# Patient Record
Sex: Female | Born: 1935 | Race: White | Hispanic: No | Smoking: Never smoker
Health system: Southern US, Community
[De-identification: ages and names within clinical notes are randomized; demographics above are authoritative.]

## PROBLEM LIST (undated history)

## (undated) DIAGNOSIS — I10 Essential (primary) hypertension: Secondary | ICD-10-CM

## (undated) DIAGNOSIS — F0390 Unspecified dementia without behavioral disturbance: Secondary | ICD-10-CM

---

## 2015-03-14 ENCOUNTER — Other Ambulatory Visit
Admission: RE | Admit: 2015-03-14 | Discharge: 2015-03-14 | Disposition: A | Discharge status: Home / Self Care | Payer: Medicare Other | Source: Ambulatory Visit | Attending: Nurse Practitioner | Admitting: Nurse Practitioner

## 2015-03-14 DIAGNOSIS — R300 Dysuria: Secondary | ICD-10-CM | POA: Insufficient documentation

## 2015-03-14 LAB — URINALYSIS COMPLETE WITH MICROSCOPIC (ARMC ONLY)
BILIRUBIN URINE: NEGATIVE
Glucose, UA: NEGATIVE mg/dL
HGB URINE DIPSTICK: NEGATIVE
Ketones, ur: NEGATIVE mg/dL
LEUKOCYTES UA: NEGATIVE
NITRITE: NEGATIVE
PH: 5.5 (ref 5.0–8.0)
Protein, ur: NEGATIVE mg/dL
Specific Gravity, Urine: 1.02 (ref 1.005–1.030)

## 2015-03-16 ENCOUNTER — Other Ambulatory Visit
Admission: RE | Admit: 2015-03-16 | Discharge: 2015-03-16 | Disposition: A | Discharge status: Home / Self Care | Payer: Medicare Other | Source: Ambulatory Visit | Attending: Nurse Practitioner | Admitting: Nurse Practitioner

## 2015-03-16 DIAGNOSIS — R7989 Other specified abnormal findings of blood chemistry: Secondary | ICD-10-CM | POA: Insufficient documentation

## 2015-03-16 DIAGNOSIS — D649 Anemia, unspecified: Secondary | ICD-10-CM | POA: Insufficient documentation

## 2015-03-16 LAB — COMPREHENSIVE METABOLIC PANEL
ALBUMIN: 3.8 g/dL (ref 3.5–5.0)
ALT: 12 U/L — ABNORMAL LOW (ref 14–54)
AST: 20 U/L (ref 15–41)
Alkaline Phosphatase: 66 U/L (ref 38–126)
Anion gap: 9 (ref 5–15)
BILIRUBIN TOTAL: 0.6 mg/dL (ref 0.3–1.2)
BUN: 33 mg/dL — AB (ref 6–20)
CO2: 28 mmol/L (ref 22–32)
Calcium: 8.6 mg/dL — ABNORMAL LOW (ref 8.9–10.3)
Chloride: 102 mmol/L (ref 101–111)
Creatinine, Ser: 1.31 mg/dL — ABNORMAL HIGH (ref 0.44–1.00)
GFR calc Af Amer: 42 mL/min — ABNORMAL LOW (ref >60)
GFR calc non Af Amer: 36 mL/min — ABNORMAL LOW (ref >60)
GLUCOSE: 167 mg/dL — AB (ref 65–99)
POTASSIUM: 4.1 mmol/L (ref 3.5–5.1)
Sodium: 139 mmol/L (ref 135–145)
TOTAL PROTEIN: 6.9 g/dL (ref 6.5–8.1)

## 2015-03-16 LAB — CBC
HEMATOCRIT: 38.1 % (ref 35.0–47.0)
HEMOGLOBIN: 12.5 g/dL (ref 12.0–16.0)
MCH: 31.6 pg (ref 26.0–34.0)
MCHC: 32.9 g/dL (ref 32.0–36.0)
MCV: 95.9 fL (ref 80.0–100.0)
Platelets: 197 10*3/uL (ref 150–440)
RBC: 3.97 MIL/uL (ref 3.80–5.20)
RDW: 13.1 % (ref 11.5–14.5)
WBC: 6 10*3/uL (ref 3.6–11.0)

## 2015-03-16 LAB — URINE CULTURE

## 2015-03-28 ENCOUNTER — Emergency Department: Payer: Medicare Other

## 2015-03-28 ENCOUNTER — Emergency Department
Admission: EM | Admit: 2015-03-28 | Discharge: 2015-03-28 | Disposition: A | Discharge status: Home / Self Care | Payer: Medicare Other | Attending: Emergency Medicine | Admitting: Emergency Medicine

## 2015-03-28 DIAGNOSIS — W01198A Fall on same level from slipping, tripping and stumbling with subsequent striking against other object, initial encounter: Secondary | ICD-10-CM | POA: Diagnosis not present

## 2015-03-28 DIAGNOSIS — S0083XA Contusion of other part of head, initial encounter: Secondary | ICD-10-CM

## 2015-03-28 DIAGNOSIS — R4182 Altered mental status, unspecified: Secondary | ICD-10-CM | POA: Diagnosis not present

## 2015-03-28 DIAGNOSIS — Y9389 Activity, other specified: Secondary | ICD-10-CM | POA: Insufficient documentation

## 2015-03-28 DIAGNOSIS — Y998 Other external cause status: Secondary | ICD-10-CM | POA: Insufficient documentation

## 2015-03-28 DIAGNOSIS — S0181XA Laceration without foreign body of other part of head, initial encounter: Secondary | ICD-10-CM

## 2015-03-28 DIAGNOSIS — S0990XA Unspecified injury of head, initial encounter: Secondary | ICD-10-CM | POA: Diagnosis present

## 2015-03-28 DIAGNOSIS — Y92128 Other place in nursing home as the place of occurrence of the external cause: Secondary | ICD-10-CM | POA: Insufficient documentation

## 2015-03-28 NOTE — ED Notes (Signed)
Pt from assisted living via EMS, C/O fall with R. Hip pain and laceration to forehead bleeding controlled

## 2015-03-28 NOTE — Discharge Instructions (Signed)
You were evaluated after head injury, no serious injury was found. Return to emergency room if any worsening condition including confusion altered mental status or signs of infection.   Facial Laceration  A facial laceration is a cut on the face. These injuries can be painful and cause bleeding. Lacerations usually heal quickly, but they need special care to reduce scarring. DIAGNOSIS  Your health care provider will take a medical history, ask for details about how the injury occurred, and examine the wound to determine how deep the cut is. TREATMENT  Some facial lacerations may not require closure. Others may not be able to be closed because of an increased risk of infection. The risk of infection and the chance for successful closure will depend on various factors, including the amount of time since the injury occurred. The wound may be cleaned to help prevent infection. If closure is appropriate, pain medicines may be given if needed. Your health care provider will use stitches (sutures), wound glue (adhesive), or skin adhesive strips to repair the laceration. These tools bring the skin edges together to allow for faster healing and a better cosmetic outcome. If needed, you may also be given a tetanus shot. HOME CARE INSTRUCTIONS  Only take over-the-counter or prescription medicines as directed by your health care provider.  Follow your health care provider's instructions for wound care. These instructions will vary depending on the technique used for closing the wound. For Sutures:  Keep the wound clean and dry.   If you were given a bandage (dressing), you should change it at least once a day. Also change the dressing if it becomes wet or dirty, or as directed by your health care provider.   Wash the wound with soap and water 2 times a day. Rinse the wound off with water to remove all soap. Pat the wound dry with a clean towel.   After cleaning, apply a thin layer of the antibiotic  ointment recommended by your health care provider. This will help prevent infection and keep the dressing from sticking.   You may shower as usual after the first 24 hours. Do not soak the wound in water until the sutures are removed.   Get your sutures removed as directed by your health care provider. With facial lacerations, sutures should usually be taken out after 4-5 days to avoid stitch marks.   Wait a few days after your sutures are removed before applying any makeup. For Skin Adhesive Strips:  Keep the wound clean and dry.   Do not get the skin adhesive strips wet. You may bathe carefully, using caution to keep the wound dry.   If the wound gets wet, pat it dry with a clean towel.   Skin adhesive strips will fall off on their own. You may trim the strips as the wound heals. Do not remove skin adhesive strips that are still stuck to the wound. They will fall off in time.  For Wound Adhesive:  You may briefly wet your wound in the shower or bath. Do not soak or scrub the wound. Do not swim. Avoid periods of heavy sweating until the skin adhesive has fallen off on its own. After showering or bathing, gently pat the wound dry with a clean towel.   Do not apply liquid medicine, cream medicine, ointment medicine, or makeup to your wound while the skin adhesive is in place. This may loosen the film before your wound is healed.   If a dressing is placed over  the wound, be careful not to apply tape directly over the skin adhesive. This may cause the adhesive to be pulled off before the wound is healed.   Avoid prolonged exposure to sunlight or tanning lamps while the skin adhesive is in place.  The skin adhesive will usually remain in place for 5-10 days, then naturally fall off the skin. Do not pick at the adhesive film.  After Healing: Once the wound has healed, cover the wound with sunscreen during the day for 1 full year. This can help minimize scarring. Exposure to  ultraviolet light in the first year will darken the scar. It can take 1-2 years for the scar to lose its redness and to heal completely.  SEEK MEDICAL CARE IF:  You have a fever. SEEK IMMEDIATE MEDICAL CARE IF:  You have redness, pain, or swelling around the wound.   You see ayellowish-white fluid (pus) coming from the wound.    This information is not intended to replace advice given to you by your health care provider. Make sure you discuss any questions you have with your health care provider.   Document Released: 03/27/2004 Document Revised: 03/10/2014 Document Reviewed: 09/30/2012 Elsevier Interactive Patient Education 2016 Elsevier Inc.  Facial or Scalp Contusion  A facial or scalp contusion is a deep bruise on the face or head. Contusions happen when an injury causes bleeding under the skin. Signs of bruising include pain, puffiness (swelling), and discolored skin. The contusion may turn blue, purple, or yellow. HOME CARE  Only take medicines as told by your doctor.  Put ice on the injured area.  Put ice in a plastic bag.  Place a towel between your skin and the bag.  Leave the ice on for 20 minutes, 2-3 times a day. GET HELP IF:  You have bite problems.  You have pain when chewing.  You are worried about your face not healing normally. GET HELP RIGHT AWAY IF:   You have severe pain or a headache and medicine does not help.  You are very tired or confused, or your personality changes.  You throw up (vomit).  You have a nosebleed that will not stop.  You see two of everything (double vision) or have blurry vision.  You have fluid coming from your nose or ear.  You have problems walking or using your arms or legs. MAKE SURE YOU:   Understand these instructions.  Will watch your condition.  Will get help right away if you are not doing well or get worse.   This information is not intended to replace advice given to you by your health care provider.  Make sure you discuss any questions you have with your health care provider.   Document Released: 02/06/2011 Document Revised: 03/10/2014 Document Reviewed: 09/30/2012 Elsevier Interactive Patient Education [employer]

## 2015-03-28 NOTE — ED Provider Notes (Signed)
[facility] Emergency Department Provider Note   ____________________________________________  Time seen: Approximately 7 PM I have reviewed the triage vital signs and the triage nursing note.  HISTORY  Chief Complaint Fall   Historian Patient, poor historian The son  HPI Janice Reese is a 82 y.o. female is from an assisted living facility. She reportedly lost her balance and struck her forehead. Denies headache or confusion or loss of consciousness. Supposedly had reported initially hip pain, but now not complaining of any pelvic or hip pain or extremity pain. No neck pain. Denies any recent illnesses such as cough, congestion, fever, vomiting, diarrhea, or concern for dehydration. Altered mental status.    History reviewed. No pertinent past medical history.  There are no active problems to display for this patient.   History reviewed. No pertinent past surgical history.  No current outpatient prescriptions on file.  Allergies Review of patient's allergies indicates no known allergies.  No family history on file.  Social History Social History  Substance Use Topics  . Smoking status: Never Smoker   . Smokeless tobacco: None  . Alcohol Use: No    Review of Systems  Constitutional: Negative for fever. Eyes: Negative for visual changes. ENT: Negative for sore throat. Cardiovascular: Negative for chest pain. Respiratory: Negative for shortness of breath. Gastrointestinal: Negative for abdominal pain, vomiting and diarrhea. Genitourinary: Negative for dysuria. Musculoskeletal: Negative for back pain. Skin: Negative for rash. Neurological: Negative for headache. 10 point Review of Systems otherwise negative ____________________________________________   PHYSICAL EXAM:  VITAL SIGNS: ED Triage Vitals  Enc Vitals Group     BP 03/28/15 1700 115/48 mmHg     Pulse Rate 03/28/15 1702 59     Resp 03/28/15 1900 16     Temp 03/28/15 1701  97.6 F (36.4 C)     Temp src --      SpO2 03/28/15 1702 100 %     Weight 03/28/15 1701 117 lb 8 oz (53.298 kg)     Height 03/28/15 1701  (1.6 m)     Head Cir --      Peak Flow --      Pain Score 03/28/15 1702 2     Pain Loc --      Pain Edu? --      Excl. in GC? --      Constitutional: Alert and cooperative, but poor historian.. Well appearing and in no distress. Eyes: Conjunctivae are normal. PERRL. Normal extraocular movements. ENT   Head: Normocephalic. Hematoma left forehead with small laceration with abrasion.   Nose: No congestion/rhinnorhea.   Mouth/Throat: Mucous membranes are moist.   Neck: No stridor. No C-spine tenderness to palpation or range of motion. Cardiovascular/Chest: Normal rate, regular rhythm.  No murmurs, rubs, or gallops. Respiratory: Normal respiratory effort without tachypnea nor retractions. Breath sounds are clear and equal bilaterally. No wheezes/rales/rhonchi. Gastrointestinal: Soft. No distention, no guarding, no rebound. Nontender.    Genitourinary/rectal:Deferred Musculoskeletal: Pelvis stable. His nontender. Nontender with normal range of motion in all extremities. No joint effusions.  No lower extremity tenderness.  No edema. Neurologic:  Normal speech and language. No gross or focal neurologic deficits are appreciated. Skin:  Skin is warm, dry. No rash.Marland Kitchen Psychiatric: Mood and affect are normal. Speech and behavior are normal. Patient exhibits appropriate insight and judgment.  ____________________________________________   EKG I, Governor Rooks, MD, the attending physician have personally viewed and interpreted all ECGs.  None ____________________________________________  LABS (pertinent positives/negatives)  None  ____________________________________________  RADIOLOGY All Xrays were viewed by me. Imaging interpreted by Radiologist.  CT head noncontrast: No acute intracranial  pathology __________________________________________  PROCEDURES  Procedure(s) performed: LACERATION REPAIR Performed by: Governor Rooks Authorized by: Governor Rooks Consent: Verbal consent obtained. Risks and benefits: risks, benefits and alternatives were discussed Consent given by: patient Wound explored  Laceration Location: Forehead  Laceration Length: 3cm  No Foreign Bodies seen or palpated   Irrigation method: syringe Amount of cleaning: standard  Skin closure: Skin glue     Patient tolerance: Patient tolerated the procedure well with no immediate complications.   Critical Care performed: None  ____________________________________________   ED COURSE / ASSESSMENT AND PLAN  Pertinent labs & imaging results that were available during my care of the patient were reviewed by me and considered in my medical decision making (see chart for details).   In terms of trauma, she does have a forehead hematoma and laceration. The skin is extremely thin and I think that stitches would probably tear through the skin. There is a tiny gap which may be a bit of an abrasion associated at the edge of the laceration.  I used skin glue and did warn for possibility of scarring there.  Family and patient feel like her off balance is pretty baseline for her and she is not having any additional symptoms concerning for any new metabolic concerns and we decided to forego any additional urine or blood testing.    CONSULTATIONS:   None   Patient / Family / Caregiver informed of clinical course, medical decision-making process, and agree with plan.   I discussed return precautions, follow-up instructions, and discharged instructions with patient and/or family.   ___________________________________________   FINAL CLINICAL IMPRESSION(S) / ED DIAGNOSES   Final diagnoses:  Facial laceration, initial encounter  Hematoma of face, initial encounter              Note:  This dictation was prepared with Dragon dictation. Any transcriptional errors that result from this process are unintentional   Governor Rooks, MD 03/28/15 2127

## 2015-04-09 ENCOUNTER — Emergency Department: Payer: Medicare Other

## 2015-04-09 ENCOUNTER — Inpatient Hospital Stay (HOSPITAL_COMMUNITY)
Admit: 2015-04-09 | Discharge: 2015-04-09 | Disposition: A | Discharge status: Home / Self Care | Payer: Medicare Other | Attending: Internal Medicine | Admitting: Internal Medicine

## 2015-04-09 ENCOUNTER — Encounter: Payer: Self-pay | Admitting: Emergency Medicine

## 2015-04-09 DIAGNOSIS — R0600 Dyspnea, unspecified: Secondary | ICD-10-CM | POA: Diagnosis not present

## 2015-04-09 DIAGNOSIS — J9601 Acute respiratory failure with hypoxia: Secondary | ICD-10-CM | POA: Diagnosis not present

## 2015-04-09 DIAGNOSIS — I25111 Atherosclerotic heart disease of native coronary artery with angina pectoris with documented spasm: Secondary | ICD-10-CM | POA: Insufficient documentation

## 2015-04-09 DIAGNOSIS — G9341 Metabolic encephalopathy: Secondary | ICD-10-CM | POA: Diagnosis present

## 2015-04-09 DIAGNOSIS — I959 Hypotension, unspecified: Secondary | ICD-10-CM | POA: Diagnosis present

## 2015-04-09 DIAGNOSIS — Z515 Encounter for palliative care: Secondary | ICD-10-CM | POA: Diagnosis not present

## 2015-04-09 DIAGNOSIS — Z66 Do not resuscitate: Secondary | ICD-10-CM | POA: Diagnosis present

## 2015-04-09 DIAGNOSIS — I214 Non-ST elevation (NSTEMI) myocardial infarction: Secondary | ICD-10-CM | POA: Diagnosis present

## 2015-04-09 DIAGNOSIS — I447 Left bundle-branch block, unspecified: Secondary | ICD-10-CM | POA: Diagnosis present

## 2015-04-09 DIAGNOSIS — N179 Acute kidney failure, unspecified: Secondary | ICD-10-CM

## 2015-04-09 DIAGNOSIS — F0390 Unspecified dementia without behavioral disturbance: Secondary | ICD-10-CM | POA: Diagnosis present

## 2015-04-09 DIAGNOSIS — I1310 Hypertensive heart and chronic kidney disease without heart failure, with stage 1 through stage 4 chronic kidney disease, or unspecified chronic kidney disease: Secondary | ICD-10-CM | POA: Insufficient documentation

## 2015-04-09 DIAGNOSIS — N189 Chronic kidney disease, unspecified: Secondary | ICD-10-CM | POA: Diagnosis present

## 2015-04-09 DIAGNOSIS — R079 Chest pain, unspecified: Secondary | ICD-10-CM

## 2015-04-09 DIAGNOSIS — R54 Age-related physical debility: Secondary | ICD-10-CM | POA: Insufficient documentation

## 2015-04-09 DIAGNOSIS — D72829 Elevated white blood cell count, unspecified: Secondary | ICD-10-CM | POA: Diagnosis present

## 2015-04-09 DIAGNOSIS — R0603 Acute respiratory distress: Secondary | ICD-10-CM | POA: Insufficient documentation

## 2015-04-09 DIAGNOSIS — I350 Nonrheumatic aortic (valve) stenosis: Secondary | ICD-10-CM | POA: Insufficient documentation

## 2015-04-09 DIAGNOSIS — I130 Hypertensive heart and chronic kidney disease with heart failure and stage 1 through stage 4 chronic kidney disease, or unspecified chronic kidney disease: Secondary | ICD-10-CM | POA: Diagnosis present

## 2015-04-09 DIAGNOSIS — I429 Cardiomyopathy, unspecified: Secondary | ICD-10-CM | POA: Diagnosis present

## 2015-04-09 DIAGNOSIS — I5021 Acute systolic (congestive) heart failure: Secondary | ICD-10-CM | POA: Diagnosis present

## 2015-04-09 DIAGNOSIS — I255 Ischemic cardiomyopathy: Secondary | ICD-10-CM | POA: Insufficient documentation

## 2015-04-09 HISTORY — DX: Unspecified dementia, unspecified severity, without behavioral disturbance, psychotic disturbance, mood disturbance, and anxiety: F03.90

## 2015-04-09 HISTORY — DX: Essential (primary) hypertension: I10

## 2015-04-09 LAB — COMPREHENSIVE METABOLIC PANEL
ALK PHOS: 67 U/L (ref 38–126)
ALT: 50 U/L (ref 14–54)
ANION GAP: 11 (ref 5–15)
AST: 86 U/L — ABNORMAL HIGH (ref 15–41)
Albumin: 3.4 g/dL — ABNORMAL LOW (ref 3.5–5.0)
BILIRUBIN TOTAL: 0.6 mg/dL (ref 0.3–1.2)
BUN: 53 mg/dL — ABNORMAL HIGH (ref 6–20)
CALCIUM: 8.5 mg/dL — AB (ref 8.9–10.3)
CO2: 23 mmol/L (ref 22–32)
CREATININE: 1.64 mg/dL — AB (ref 0.44–1.00)
Chloride: 104 mmol/L (ref 101–111)
GFR, EST AFRICAN AMERICAN: 32 mL/min — AB (ref >60)
GFR, EST NON AFRICAN AMERICAN: 28 mL/min — AB (ref >60)
GLUCOSE: 153 mg/dL — AB (ref 65–99)
Potassium: 4.5 mmol/L (ref 3.5–5.1)
Sodium: 138 mmol/L (ref 135–145)
TOTAL PROTEIN: 6.5 g/dL (ref 6.5–8.1)

## 2015-04-09 LAB — CBC
HEMATOCRIT: 34.1 % — AB (ref 35.0–47.0)
HEMOGLOBIN: 11.4 g/dL — AB (ref 12.0–16.0)
MCH: 30.9 pg (ref 26.0–34.0)
MCHC: 33.3 g/dL (ref 32.0–36.0)
MCV: 92.9 fL (ref 80.0–100.0)
Platelets: 184 10*3/uL (ref 150–440)
RBC: 3.68 MIL/uL — ABNORMAL LOW (ref 3.80–5.20)
RDW: 12.9 % (ref 11.5–14.5)
WBC: 10.9 10*3/uL (ref 3.6–11.0)

## 2015-04-09 LAB — URINALYSIS COMPLETE WITH MICROSCOPIC (ARMC ONLY)
Bilirubin Urine: NEGATIVE
Glucose, UA: NEGATIVE mg/dL
Hgb urine dipstick: NEGATIVE
Ketones, ur: NEGATIVE mg/dL
Nitrite: NEGATIVE
PROTEIN: NEGATIVE mg/dL
SPECIFIC GRAVITY, URINE: 1.027 (ref 1.005–1.030)
SQUAMOUS EPITHELIAL / LPF: NONE SEEN
pH: 5 (ref 5.0–8.0)

## 2015-04-09 LAB — PROTIME-INR
INR: 1.06
Prothrombin Time: 14 seconds (ref 11.4–15.0)

## 2015-04-09 LAB — HEMOGLOBIN A1C: Hgb A1c MFr Bld: 5 % (ref 4.0–6.0)

## 2015-04-09 LAB — HEPARIN LEVEL (UNFRACTIONATED): HEPARIN UNFRACTIONATED: 0.31 [IU]/mL (ref 0.30–0.70)

## 2015-04-09 LAB — TROPONIN I
Troponin I: 6.53 ng/mL — ABNORMAL HIGH (ref <0.031)
Troponin I: 7.91 ng/mL — ABNORMAL HIGH (ref <0.031)

## 2015-04-09 LAB — APTT: APTT: 28 s (ref 24–36)

## 2015-04-09 LAB — MRSA PCR SCREENING: MRSA BY PCR: NEGATIVE

## 2015-04-09 LAB — GLUCOSE, CAPILLARY: Glucose-Capillary: 198 mg/dL — ABNORMAL HIGH (ref 65–99)

## 2015-04-09 MED ORDER — DONEPEZIL HCL 5 MG PO TABS: 10.0000 mg | ORAL_TABLET | Freq: Every day | ORAL | Status: DC

## 2015-04-09 MED ORDER — HEPARIN BOLUS VIA INFUSION
3200.0000 [IU] | Freq: Once | INTRAVENOUS | Status: AC
  Administered 2015-04-09: 3200 [IU] via INTRAVENOUS
  Filled 2015-04-09: qty 3200

## 2015-04-09 MED ORDER — SODIUM CHLORIDE 0.9 % IV BOLUS (SEPSIS)
1000.0000 mL | Freq: Once | INTRAVENOUS | Status: AC
  Administered 2015-04-09: 500 mL via INTRAVENOUS

## 2015-04-09 MED ORDER — NITROGLYCERIN 0.4 MG SL SUBL: 0.4000 mg | SUBLINGUAL_TABLET | SUBLINGUAL | Status: DC | PRN

## 2015-04-09 MED ORDER — ACETAMINOPHEN 325 MG PO TABS: 650.0000 mg | ORAL_TABLET | ORAL | Status: DC | PRN

## 2015-04-09 MED ORDER — SODIUM CHLORIDE 0.9 % IV SOLN: INTRAVENOUS | Status: DC

## 2015-04-09 MED ORDER — ATORVASTATIN CALCIUM 20 MG PO TABS: 80.0000 mg | ORAL_TABLET | Freq: Every day | ORAL | Status: DC

## 2015-04-09 MED ORDER — FUROSEMIDE 10 MG/ML IJ SOLN
40.0000 mg | Freq: Once | INTRAMUSCULAR | Status: AC
  Administered 2015-04-09: 40 mg via INTRAVENOUS

## 2015-04-09 MED ORDER — ASPIRIN 81 MG PO CHEW
324.0000 mg | CHEWABLE_TABLET | ORAL | Status: AC
  Administered 2015-04-09: 324 mg via ORAL
  Filled 2015-04-09: qty 4

## 2015-04-09 MED ORDER — FUROSEMIDE 10 MG/ML IJ SOLN
INTRAMUSCULAR | Status: AC
  Administered 2015-04-09: 40 mg via INTRAVENOUS
  Filled 2015-04-09: qty 4

## 2015-04-09 MED ORDER — ASPIRIN EC 81 MG PO TBEC: 81.0000 mg | DELAYED_RELEASE_TABLET | Freq: Every day | ORAL | Status: DC

## 2015-04-09 MED ORDER — CETYLPYRIDINIUM CHLORIDE 0.05 % MT LIQD
7.0000 mL | Freq: Two times a day (BID) | OROMUCOSAL | Status: DC
Start: 2015-04-09 — End: 2015-04-10
  Administered 2015-04-09: 7 mL via OROMUCOSAL

## 2015-04-09 MED ORDER — MORPHINE SULFATE (PF) 2 MG/ML IV SOLN
INTRAVENOUS | Status: AC
  Administered 2015-04-09: 2 mg via INTRAVENOUS
  Filled 2015-04-09: qty 1

## 2015-04-09 MED ORDER — ASPIRIN 300 MG RE SUPP: 300.0000 mg | RECTAL | Status: AC

## 2015-04-09 MED ORDER — ONDANSETRON HCL 4 MG/2ML IJ SOLN: 4.0000 mg | Freq: Four times a day (QID) | INTRAMUSCULAR | Status: DC | PRN

## 2015-04-09 MED ORDER — PRAVASTATIN SODIUM 20 MG PO TABS: 20.0000 mg | ORAL_TABLET | Freq: Every day | ORAL | Status: DC

## 2015-04-09 MED ORDER — MORPHINE SULFATE (PF) 2 MG/ML IV SOLN
2.0000 mg | Freq: Once | INTRAVENOUS | Status: AC
  Administered 2015-04-09: 2 mg via INTRAVENOUS

## 2015-04-09 MED ORDER — HEPARIN (PORCINE) IN NACL 100-0.45 UNIT/ML-% IJ SOLN
700.0000 [IU]/h | INTRAMUSCULAR | Status: DC
  Administered 2015-04-09: 650 [IU]/h via INTRAVENOUS
  Filled 2015-04-09 (×2): qty 250

## 2015-04-09 NOTE — NC FL2 (Signed)
  [place] MEDICAID FL2 LEVEL OF CARE SCREENING TOOL     IDENTIFICATION  Patient Name: Janice Reese Birthdate: 07/17/1928 Sex: female Admission Date (Current Location): 04/21/2015  [place] and [place] Number:  Chiropodist and Address:  [facility], [address] [place], [place] [address]      Provider Number: [id number]  Attending Physician Name and Address:  Wyatt Haste, MD  Relative Name and Phone Number:       Current Level of Care: Hospital Recommended Level of Care: Assisted Living Facility Prior Approval Number:    Date Approved/Denied:   PASRR Number:    Discharge Plan: SNF    Current Diagnoses: Patient Active Problem List   Diagnosis Date Noted  . NSTEMI (non-ST elevated myocardial infarction) (HCC) 04/24/2015  . AKI (acute kidney injury) (HCC) 04/29/2015    Orientation RESPIRATION BLADDER Height & Weight     Self  O2 (Nasal Cannula 5 (L/min) ) Continent Weight: 112 lb 12.8 oz (51.166 kg) Height:   (162.6 cm)  BEHAVIORAL SYMPTOMS/MOOD NEUROLOGICAL BOWEL NUTRITION STATUS   (None )  (None ) Continent Diet (Heart )  AMBULATORY STATUS COMMUNICATION OF NEEDS Skin   Limited Assist Verbally Normal                       Personal Care Assistance Level of Assistance  Bathing, Feeding, Dressing Bathing Assistance: Limited assistance Feeding assistance: Independent Dressing Assistance: Limited assistance     Functional Limitations Info  Sight, Hearing, Speech Sight Info: Impaired Hearing Info: Adequate Speech Info: Adequate    SPECIAL CARE FACTORS FREQUENCY                       Contractures      Additional Factors Info  Code Status, Allergies Code Status Info:  (DNR ) Allergies Info:  (None )           Current Medications (04/05/2015):  This is the current hospital active medication list Current Facility-Administered Medications  Medication Dose Route Frequency Provider Last Rate Last Dose   . acetaminophen (TYLENOL) tablet 650 mg  650 mg Oral Q4H PRN Wyatt Haste, MD      . Melene Muller ON 05-08-15] aspirin EC tablet 81 mg  81 mg Oral Daily Wyatt Haste, MD      . atorvastatin (LIPITOR) tablet 80 mg  80 mg Oral q1800 Wyatt Haste, MD      . heparin ADULT infusion 100 units/mL (25000 units/250 mL)  650 Units/hr Intravenous Continuous Wyatt Haste, MD 6.5 mL/hr at 04/08/2015 1452 650 Units/hr at 04/22/2015 1452  . nitroGLYCERIN (NITROSTAT) SL tablet 0.4 mg  0.4 mg Sublingual Q5 Min x 3 PRN Wyatt Haste, MD      . ondansetron [facility]) injection 4 mg  4 mg Intravenous Q6H PRN Wyatt Haste, MD         Discharge Medications: Please see discharge summary for a list of discharge medications.  Relevant Imaging Results:  Relevant Lab Results:   Additional Information  (SSN [id number])  Verta Ellen Sunkins, LCSW

## 2015-04-09 NOTE — ED Notes (Signed)
Attempted to call report to 2A.  Pt will be assigned to 251 and room being cleaned.  Told to call back in 10 minutes.

## 2015-04-09 NOTE — Clinical Social Work Note (Signed)
Clinical Social Work Assessment  Patient Details  Name: Janice Reese MRN: [id number] Date of Birth: September 18, 1928  Date of referral:  04/11/2015               Reason for consult:  Other (Comment Required) (Chest pains/dementia)                Permission sought to share information with:  Facility Medical sales representative, Family Supports Permission granted to share information::  Yes, Verbal Permission Granted  Name::     Son Janice Reese [phone] Janice Reese [phone]  Agency::  [facility]  Relationship::  son and gransdon  Contact Information:  yes  Housing/Transportation Living arrangements for the past 2 months:  Skilled Building surveyor of Information:  Adult Children Patient Interpreter Needed:  None Criminal Activity/Legal Involvement Pertinent to Current Situation/Hospitalization:  No - Comment as needed Significant Relationships:  Adult Children, Other Family Members (Grandson Janice Reese) Lives with:  Facility Resident Do you feel safe going back to the place where you live?  Yes Need for family participation in patient care:  Yes (Comment)  Care giving concerns: His grandmother has had dementia for 5 years but is pretty mobile   Office manager / plan:  Patient will be admitted to 2nd floor and once better will return to [employer]  Employment status:  Retired Health and safety inspector:  Medicaid In [place] PT Recommendations:  Skilled Nursing Facility Information / Referral to community resources:  Acute Rehab  Patient/Family's Response to care:  TBD Son was not present for interview. He is in [place] and grandson was here  Patient/Family's Understanding of and Emotional Response to Diagnosis, Current Treatment, and Prognosis:  Hopes his grandmother gets better ( recent loss of his own mother Sept 2016)none  Emotional Assessment Appearance:  Appears stated age Attitude/Demeanor/Rapport:  Lethargic Affect (typically observed):   Calm, Flat, Withdrawn Orientation:  Fluctuating Orientation (Suspected and/or reported Sundowners) Alcohol / Substance use:   none Psych involvement (Current and /or in the community):  No (Comment)  Discharge Needs  Concerns to be addressed:  No discharge needs identified Readmission within the last 30 days:  No Current discharge risk:  Cognitively Impaired, None Barriers to Discharge:  No Barriers Identified   Cheron Schaumann, LCSW 04/06/2015, 2:48 PM

## 2015-04-09 NOTE — Progress Notes (Signed)
Notified Dr. Anne Hahn of most recent troponin of 8.44 md acknowledged stated to continue trending troponin's, pt has not c/o chest pain will continue to monitor and assess pt

## 2015-04-09 NOTE — Progress Notes (Addendum)
Pt. admitted to unit, rm251 from ED, report from Jeannett Senior, [place]. Oriented to room, call bell, Ascom phones and staff. Bed in low position. Fall safety plan reviewed, pt is confused so RN is only signature on contract which was placed on wall, yellow non-skid socks in place, bed alarm on. Full assessment to Epic; skin assessed with Greer Ee, RN; abrasion to left forehead & R ankle; L ankle cut (cleansed and dressed). Telemetry box verified with tele clerk and Linford Arnold, RN: [phone] . Will continue to monitor.    Patient has severe dementia and unable to answer most admission questions. Grandson able to help out with most of the admission, but unable to answer a few of them; facility provided no paperwork regarding flu or pneumonia vaccination status so these questions have been left unanswered for the time being.   Heparin drip initiated & verified at bedside with Baxter Hire, RN.

## 2015-04-09 NOTE — Progress Notes (Signed)
*  PRELIMINARY RESULTS* Echocardiogram 2D Echocardiogram has been performed.  Janice Reese 27-Apr-2015, 10:22 PM

## 2015-04-09 NOTE — Plan of Care (Signed)
Problem: Consults Goal: Chest Pain Patient Education (See Patient Education module for education specifics.)  Outcome: Progressing Education provided to grandson, who is currently at bedside.  Problem: Phase I Progression Outcomes Goal: Hemodynamically stable Outcome: Progressing Patient currently requiring 5L supplemental O2. Goal: Anginal pain relieved Outcome: Completed/Met Date Met:  04/20/2015 No chest pain at this time.  Problem: Education: Goal: Knowledge of [facility] Education information/materials will improve Outcome: Progressing Education provided to grandson.

## 2015-04-09 NOTE — Progress Notes (Addendum)
CSW contacted [name] at [facility]. Per Janice Reese patient is a resident at their facility. She reports that patient uses O2 and a walker at baseline. Per [facility] patient does not have any HH services at this time. She reports that patient can return to [facility] at discharge. Janice Reese requested to be given a "heads up" of patient's discharge date so that she may "evaluate" patient. FL2 completed and faxed to [facility] at [facility]. PASRR does not exist and is not needed per [name].   CSW contacted patient's son/ HPOA Janice Reese. Left a voicemail. Awaiting call back. CSW will continue to follow and assist.   [name], MSW, LCSW-A Clinical Social Work Department [phone]

## 2015-04-09 NOTE — ED Notes (Signed)
Pt from [facility] via ems with weakness and altered mental status today; facility states hx of dementia but that pt seems different today, and was difficult to rouse.

## 2015-04-09 NOTE — Progress Notes (Signed)
RN called into room by patients grandson, upon arrival, RN found patient diaphoretic, audible crackles, SpO2 70s on 5L O2. NRB placed, respiratory called, MD notified, orders for  iv lasix received and medication given. MD at bedside currently speaking with family in room via telephone, discussing BiPAP. SpO2 82% on NRB currently.

## 2015-04-09 NOTE — H&P (Signed)
[facility] - [place] at [facility]   PATIENT NAME: Janice Reese    MR#:  [id number]  DATE OF BIRTH:  April 21, 1928   DATE OF ADMISSION:  04-21-2015  PRIMARY CARE PHYSICIAN: No primary care provider on file.   REQUESTING/REFERRING PHYSICIAN: Paduchowski  CHIEF COMPLAINT:   Chest pain  HISTORY OF PRESENT ILLNESS:  Janice Reese  is a 82 y.o. female with a known history of dementia without behavioral disturbances, essential hypertension who is presenting with chest pain. She is unable to provide meaningful information given mental status/medical condition secondary to baseline dementia. Per history patient was slightly more confused today at her nursing facility as well as complaining of chest discomfort. When asked she currently states "I don't feel well" but unable to further specify. On arrival to emergency department noted to have elevated troponin as well as abnormal EKG concerning for lateral infarct  PAST MEDICAL HISTORY:   Past Medical History  Diagnosis Date  . Hypertension   . Dementia     PAST SURGICAL HISTORY:  History reviewed. No pertinent past surgical history.  SOCIAL HISTORY:   Social History  Substance Use Topics  . Smoking status: Never Smoker   . Smokeless tobacco: Not on file  . Alcohol Use: No    FAMILY HISTORY:   Family History  Problem Relation Age of Onset  . Diabetes Mellitus II Neg Hx     DRUG ALLERGIES:  No Known Allergies  REVIEW OF SYSTEMS:  Unobtainable at this time given patient's mental status/medical condition   MEDICATIONS AT HOME:   Prior to Admission medications   Not on File      VITAL SIGNS:  Blood pressure 93/58, pulse 75, temperature 97.6 F (36.4 C), temperature source Oral, resp. rate 23, height  (1.626 m), weight 120 lb (54.432 kg), SpO2 97 %.  PHYSICAL EXAMINATION:   VITAL SIGNS: Filed Vitals:   Apr 21, 2015 1115 04-21-15 1130  BP: 104/71 93/58  Pulse: 71 75  Temp:    Resp:  24 23   GENERAL:82 y.o.female moderate distress given mental status.  HEAD: Normocephalic, atraumatic.  EYES: Pupils equal, round, reactive to light. Unable to assess extraocular muscles given mental status/medical condition. No scleral icterus.  MOUTH: Moist mucosal membrane. Dentition intact. No abscess noted.  EAR, NOSE, THROAT: Clear without exudates. No external lesions.  NECK: Supple. No thyromegaly. No nodules. No JVD.  PULMONARY: Clear to ascultation, without wheeze rails or rhonci. No use of accessory muscles, Good respiratory effort. good air entry bilaterally CHEST: Nontender to palpation.  CARDIOVASCULAR: S1 and S2. Regular rate and rhythm. No murmurs, rubs, or gallops. No edema. Pedal pulses 2+ bilaterally.  GASTROINTESTINAL: Soft, nontender, nondistended. No masses. Positive bowel sounds. No hepatosplenomegaly.  MUSCULOSKELETAL: No swelling, clubbing, or edema. Range of motion full in all extremities.  NEUROLOGIC: Unable to assess given mental status/medical condition SKIN: Multiple skin tears the largest on the left lower extremity/shin with dressing intact with serous discharge No ulceration, lesions, rashes, or cyanosis. Skin warm and dry. Turgor intact.  PSYCHIATRIC: Unable to assess given mental status/medical condition. She is oriented 1      LABORATORY PANEL:   CBC  Recent Labs Lab Apr 21, 2015 1033  WBC 10.9  HGB 11.4*  HCT 34.1*  PLT 184   ------------------------------------------------------------------------------------------------------------------  Chemistries   Recent Labs Lab 21-Apr-2015 1033  NA 138  K 4.5  CL 104  CO2 23  GLUCOSE 153*  BUN 53*  CREATININE 1.64*  CALCIUM 8.5*  AST 86*  ALT 50  ALKPHOS 67  BILITOT 0.6   ------------------------------------------------------------------------------------------------------------------  Cardiac Enzymes  Recent Labs Lab 04/17/2015 1033  TROPONINI 6.53*    ------------------------------------------------------------------------------------------------------------------  RADIOLOGY:  Dg Chest Portable 1 View  04/06/2015  CLINICAL DATA:  Increased confusion of unknown duration, hypoxia in the emergency room. History of dementia. EXAM: PORTABLE CHEST 1 VIEW COMPARISON:  None. FINDINGS: There is cardiomegaly, mild to moderate in degree. There is central pulmonary vascular congestion and bilateral interstitial edema. Opacities at each lung base are likely a combination of small pleural effusions and atelectasis. Osseous structures about the chest are unremarkable. IMPRESSION: Cardiomegaly with central pulmonary vascular congestion and bilateral interstitial edema indicating volume overload/CHF. Opacities at each lung base are likely associated small pleural effusions and atelectasis. Electronically Signed   By: Bary Richard M.D.   On: 04/06/2015 11:08    EKG:  No orders found for this or any previous visit.  IMPRESSION AND PLAN:   82 year old Caucasian female history of dementia essential hypertension who is presenting with chest pain  1. NSTEMI: Initiate aspirin and statin therapy, admitted to telemetry, trend cardiac enzymes 3,  heparin drip ,nitroglycerin when necessary, morphine when necessary, consult cardiology, check echocardiogram, hold off on beta blockade given hypotension 2. Essential hypertension: Per documentation on no medications she is currently relatively hypotensive 3. Acute kidney injury: Gentle IV fluid hydration and follow urine output and renal function 4. Venous thromboembolism prophylactic: Therapeutic heparin      All the records are reviewed and case discussed with ED provider. Management plans discussed with the patient, family and they are in agreement.  CODE STATUS: DO NOT RESUSCITATE  TOTAL TIME TAKING CARE OF THIS PATIENT: 40 minutes.    Hower,  Mardi Mainland.D on 04/17/2015 at 12:46 PM  Between 7am to 6pm -  Pager - [phone]  After 6pm: House Pager: - [phone]  [facility]  Office  [phone]  CC: Primary care physician; No primary care provider on file.

## 2015-04-09 NOTE — Progress Notes (Addendum)
Patient transferred to ICU 4, report called to [place], Charity fundraiser. Gershon Cull, at bedside, updated with new room number. Belongings (including glasses) packed and transported with patient to ICU. Respiratory accompanying with RN to bed 4. Telemetry box 03 returned to clerk.

## 2015-04-09 NOTE — Progress Notes (Addendum)
LCSW met with patient and her grandson. Completed assessment. Patient has resided at this home for only a month and was independent in most ADL and was able to walk with a cane. Has had worsening dementia (5 years) Her son Enaya Howze is from [place] [phone] and is her HCPOA.  LCSW contacted other LCSW to exchange information.   No further involvement  [phone]

## 2015-04-09 NOTE — Progress Notes (Signed)
[facility] - [place] at [facility]   PATIENT NAME: Janice Reese    MR#:  [id number]  DATE OF BIRTH:  01/15/1929  SUBJECTIVE:  Called by nursing staff patient respiratory distress tachypneic saturating 80s on nonrebreather   REVIEW OF SYSTEMS:  Unable to provide at this time given mental status medical condition  DRUG ALLERGIES:  No Known Allergies  VITALS:  Blood pressure 128/48, pulse 84, temperature 97.9 F (36.6 C), temperature source Oral, resp. rate 24, height  (1.626 m), weight 112 lb 12.8 oz (51.166 kg), SpO2 88 %.  PHYSICAL EXAMINATION:   VITAL SIGNS: Filed Vitals:   04-18-2015 1735 April 18, 2015 1743  BP: 120/61 128/48  Pulse:    Temp:    Resp:     GENERAL:82 y.o.female moderate distress given mental status.  HEAD: Normocephalic, atraumatic.  EYES: Pupils equal, round, reactive to light. Unable to assess extraocular muscles given mental status/medical condition. No scleral icterus.  MOUTH: Moist mucosal membrane. Dentition intact. No abscess noted.  EAR, NOSE, THROAT: Clear without exudates. No external lesions.  NECK: Supple. No thyromegaly. No nodules. No JVD.  PULMONARY: Diffuse coarse rhonchi, tachypneic, use of accessory muscles, Good respiratory effort. Poor air entry bilaterally CHEST: Nontender to palpation.  CARDIOVASCULAR: S1 and S2. Bradycardic. No murmurs, rubs, or gallops. No edema. Pedal pulses 2+ bilaterally.  GASTROINTESTINAL: Soft, nontender, nondistended. No masses. Positive bowel sounds. No hepatosplenomegaly.  MUSCULOSKELETAL: No swelling, clubbing, or edema. Range of motion full in all extremities.  NEUROLOGIC: Unable to assess given mental status/medical condition SKIN: No ulceration, lesions, rashes, or cyanosis. Skin warm and dry. Turgor intact.  PSYCHIATRIC: Unable to assess given mental status/medical condition        LABORATORY PANEL:   CBC  Recent Labs Lab 04-18-2015 1033  WBC 10.9  HGB 11.4*   HCT 34.1*  PLT 184   ------------------------------------------------------------------------------------------------------------------  Chemistries   Recent Labs Lab April 18, 2015 1033  NA 138  K 4.5  CL 104  CO2 23  GLUCOSE 153*  BUN 53*  CREATININE 1.64*  CALCIUM 8.5*  AST 86*  ALT 50  ALKPHOS 67  BILITOT 0.6   ------------------------------------------------------------------------------------------------------------------  Cardiac Enzymes  Recent Labs Lab Apr 18, 2015 1502  TROPONINI 7.91*   ------------------------------------------------------------------------------------------------------------------  RADIOLOGY:  Dg Chest Portable 1 View  04-18-15  CLINICAL DATA:  Increased confusion of unknown duration, hypoxia in the emergency room. History of dementia. EXAM: PORTABLE CHEST 1 VIEW COMPARISON:  None. FINDINGS: There is cardiomegaly, mild to moderate in degree. There is central pulmonary vascular congestion and bilateral interstitial edema. Opacities at each lung base are likely a combination of small pleural effusions and atelectasis. Osseous structures about the chest are unremarkable. IMPRESSION: Cardiomegaly with central pulmonary vascular congestion and bilateral interstitial edema indicating volume overload/CHF. Opacities at each lung base are likely associated small pleural effusions and atelectasis. Electronically Signed   By: Bary Richard M.D.   On: 18-Apr-2015 11:08    EKG:   Orders placed or performed during the hospital encounter of April 18, 2015  . EKG 12-Lead  . EKG 12-Lead    ASSESSMENT AND PLAN:   82 year old Caucasian female admitted with NSTEMI now with respiratory distress  1. Acute respiratory failure with hypoxia: Secondary to congestive heart failure/ pulmonary edema: Case discussed with family at bedside as well as via telephone-Lasix 40 mg IV 1, morphine 2 mg 1, BiPAP therapy will require transfer to stepdown 2. NSTEMI: Continue heparin and  aspirin statin therapy   Expressed to the family that  the patient has a high likelihood of dying from her initial heart attack and has a poor prognosis  All the records are reviewed and case discussed with Care Management/Social Workerr. Management plans discussed with the patient, family and they are in agreement.  CODE STATUS: DO NOT RESUSCITATE  TOTAL TIME TAKING CARE OF THIS PATIENT: 45 critical care minutes.     Tymere Depuy,  Mardi Mainland.D on 04/15/2015 at 5:55 PM  Between 7am to 6pm - Pager - [phone]  After 6pm: House Pager: - [phone]  [facility]  Office  [phone]  CC: Primary care physician; No primary care provider on file.

## 2015-04-09 NOTE — Progress Notes (Addendum)
ANTICOAGULATION CONSULT NOTE - Initial Consult  Pharmacy Consult for Heparin Drip Indication: chest pain/ACS  No Known Allergies  Patient Measurements: Height:  (162.6 cm) Weight: 120 lb (54.432 kg) IBW/kg (Calculated) : 54.7 Heparin Dosing Weight: 54.4 kg  Vital Signs: Temp: 97.6 F (36.4 C) (02/06 1028) Temp Source: Oral (02/06 1028) BP: 115/67 mmHg (02/06 1245) Pulse Rate: 71 (02/06 1245)  Labs:  Recent Labs  04/13/15 1033  HGB 11.4*  HCT 34.1*  PLT 184  CREATININE 1.64*  TROPONINI 6.53*    Estimated Creatinine Clearance: 21.9 mL/min (by C-G formula based on Cr of 1.64).   Medical History: Past Medical History  Diagnosis Date  . Hypertension   . Dementia     Medications:  Scheduled:  . aspirin  324 mg Oral NOW   Or  . aspirin  300 mg Rectal NOW  . [START ON 04/06/2015] aspirin EC  81 mg Oral Daily  . atorvastatin  80 mg Oral q1800  . heparin  3,200 Units Intravenous Once   Infusions:  . sodium chloride    . heparin      Assessment: 82 year old female history of dementia essential hypertension who is presenting with chest pain/NSTEMI  Goal of Therapy:  Heparin level 0.3-0.7 units/ml   Plan:  Give 3200 units bolus x 1 Start heparin infusion at 650 units/hr Check anti-Xa level in 8 hours and daily while on heparin Continue to monitor H&H and platelets   Anti-X ordered for 2/6 at 23:00.   Stormy Card, [facility] Clinical Pharmacist 04/13/15,1:04 PM

## 2015-04-09 NOTE — ED Provider Notes (Addendum)
[facility] Emergency Department Provider Note  Time seen: 10:52 AM  I have reviewed the triage vital signs and the nursing notes.   HISTORY  Chief Complaint Altered Mental Status    HPI Janice Reese is a 82 y.o. female with a past medical history of hypertension and dementia who presents the emergency department with altered mental status. According to nursing home staff per EMS report the patient initially was complaining of some chest discomfort this morning, was then found to be more somnolent and confused than normal so they sent her here for evaluation.Here the patient denies any complaints but she has a history of dementia. Patient is hypotensive 87/55 and hypoxic 92% on 4 L, the patient does not have a normal oxygen requirement. Patient denies any pain or discomfort. However the patient cannot contribute much to the history due to dementia.     Past Medical History  Diagnosis Date  . Hypertension     There are no active problems to display for this patient.   History reviewed. No pertinent past surgical history.  No current outpatient prescriptions on file.  Allergies Review of patient's allergies indicates no known allergies.  History reviewed. No pertinent family history.  Social History Social History  Substance Use Topics  . Smoking status: Never Smoker   . Smokeless tobacco: None  . Alcohol Use: No    Review of Systems Unable to obtain an acute reviewed systems due to dementia.  ____________________________________________   PHYSICAL EXAM:  VITAL SIGNS: ED Triage Vitals  Enc Vitals Group     BP 2015-04-27 1028 87/55 mmHg     Pulse Rate 04-27-15 1028 70     Resp April 27, 2015 1028 24     Temp 04-27-15 1028 97.6 F (36.4 C)     Temp Source 2015/04/27 1028 Oral     SpO2 27-Apr-2015 1028 91 %     Weight 2015-04-27 1028 120 lb (54.432 kg)     Height 04/27/2015 1028  (1.626 m)     Head Cir --      Peak Flow --      Pain Score --       Pain Loc --      Pain Edu? --      Excl. in GC? --     Constitutional: Alert, pleasant. Well appearing and in no distress. Denies any complaints. Eyes: Normal exam ENT   Head: Normocephalic and atraumatic.   Mouth/Throat: Mucous membranes are moist. Cardiovascular: Normal rate, regular rhythm. Respiratory: No tachypnea, no retractions, crackles auscultated bilaterally. Gastrointestinal: Soft and nontender. No distention.   Musculoskeletal: Nontender with normal range of motion in all extremities. No lower extremity tenderness  Neurologic:  Normal speech and language. No gross focal neurologic deficits Skin:  Skin is warm, dry and intact.  Psychiatric: Mood and affect are normal. Speech and behavior are normal.   ____________________________________________    EKG  EKG reviewed and interpreted by myself shows normal sinus rhythm at 70 bpm, with widened QRS complex, normal axis, ST elevation in V1 and V2 and V3, with lateral ST depressions, most consistent with left bundle branch block, cannot rule out MI.  I was able to obtain an old x-ray from the primary care physician from 02/22/15 showing sinus bradycardia at 53 bpm with a left bundle branch block, widened QRS complex with a left axis deviation, significant ST elevation in V1, V2, V3,. Today's ST depressions are much more significant than previous EKG, also today's EKG appears  to have inverted T waves in the inferior segments which is new from December.  ____________________________________________    RADIOLOGY  Chest x-ray shows vascular congestion with interstitial edema  ____________________________________________    INITIAL IMPRESSION / ASSESSMENT AND PLAN / ED COURSE  Pertinent labs & imaging results that were available during my care of the patient were reviewed by me and considered in my medical decision making (see chart for details).  Patient presents with some chest pain earlier, increased  somnolence and confusion, patient has dementia and denies any complaints here. Patient's EKG is concerning for MI especially with new T-wave inversions inferiorly, and more pronounced ST depressions laterally suspect likely inferolateral cardiac involvement given elevated troponin of 6.5 today. I discussed with the son via phone multiple times. He states given the patient's advanced age and advanced dementia he would prefer to avoid cardiac catheterization and treat medically. He states his main goal is to keep her comfortable, but did not want any extreme measures taken. Patient has a DO NOT RESUSCITATE order with her. Patient has been given aspirin in the emergency department. I discussed the patient with Dr. Kirke Corin.  We will admit to the hospital for an NSTEMI. Patient's blood pressure has improved with fluids, currently 104 systolic. Patient remains well appearing with no complaints.  CRITICAL CARE Performed by: Minna Antis   Total critical care time: 45 minutes  Critical care time was exclusive of separately billable procedures and treating other patients.  Critical care was necessary to treat or prevent imminent or life-threatening deterioration.  Critical care was time spent personally by me on the following activities: development of treatment plan with patient and/or surrogate as well as nursing, discussions with consultants, evaluation of patient's response to treatment, examination of patient, obtaining history from patient or surrogate, ordering and performing treatments and interventions, ordering and review of laboratory studies, ordering and review of radiographic studies, pulse oximetry and re-evaluation of patient's condition.   ____________________________________________   FINAL CLINICAL IMPRESSION(S) / ED DIAGNOSES  NSTEMI   Minna Antis, MD 04/21/2015 1136  Minna Antis, MD 04/17/2015 [phone]

## 2015-04-09 NOTE — Progress Notes (Signed)
eLink Physician-Brief Progress Note Patient Name: Jordan Caraveo DOB: [birth year] MRN: [id number]   Date of Service  05/01/2015  HPI/Events of Note  82 yo female admitted with acute hypoxic respiratory failure from NSTEMI and CHF.  DNR/DNI.   eICU Interventions  No additional elink interventions at this time.        Joshuah Minella 04/23/2015, 8:31 PM

## 2015-04-10 DIAGNOSIS — I130 Hypertensive heart and chronic kidney disease with heart failure and stage 1 through stage 4 chronic kidney disease, or unspecified chronic kidney disease: Secondary | ICD-10-CM

## 2015-04-10 DIAGNOSIS — I1310 Hypertensive heart and chronic kidney disease without heart failure, with stage 1 through stage 4 chronic kidney disease, or unspecified chronic kidney disease: Secondary | ICD-10-CM | POA: Insufficient documentation

## 2015-04-10 DIAGNOSIS — I255 Ischemic cardiomyopathy: Secondary | ICD-10-CM

## 2015-04-10 DIAGNOSIS — I214 Non-ST elevation (NSTEMI) myocardial infarction: Principal | ICD-10-CM

## 2015-04-10 DIAGNOSIS — I509 Heart failure, unspecified: Secondary | ICD-10-CM

## 2015-04-10 DIAGNOSIS — R0600 Dyspnea, unspecified: Secondary | ICD-10-CM

## 2015-04-10 DIAGNOSIS — R54 Age-related physical debility: Secondary | ICD-10-CM | POA: Insufficient documentation

## 2015-04-10 DIAGNOSIS — N179 Acute kidney failure, unspecified: Secondary | ICD-10-CM

## 2015-04-10 DIAGNOSIS — R0603 Acute respiratory distress: Secondary | ICD-10-CM | POA: Insufficient documentation

## 2015-04-10 DIAGNOSIS — I350 Nonrheumatic aortic (valve) stenosis: Secondary | ICD-10-CM | POA: Insufficient documentation

## 2015-04-10 DIAGNOSIS — I25111 Atherosclerotic heart disease of native coronary artery with angina pectoris with documented spasm: Secondary | ICD-10-CM | POA: Insufficient documentation

## 2015-04-10 LAB — LIPID PANEL
Cholesterol: 178 mg/dL (ref 0–200)
HDL: 85 mg/dL (ref >40)
LDL CALC: 81 mg/dL (ref 0–99)
Total CHOL/HDL Ratio: 2.1 RATIO
Triglycerides: 61 mg/dL (ref <150)
VLDL: 12 mg/dL (ref 0–40)

## 2015-04-10 LAB — CBC
HCT: 40.2 % (ref 35.0–47.0)
HEMATOCRIT: 38.7 % (ref 35.0–47.0)
HEMOGLOBIN: 12.7 g/dL (ref 12.0–16.0)
Hemoglobin: 12.9 g/dL (ref 12.0–16.0)
MCH: 30.5 pg (ref 26.0–34.0)
MCH: 30.4 pg (ref 26.0–34.0)
MCHC: 32.7 g/dL (ref 32.0–36.0)
MCHC: 32.2 g/dL (ref 32.0–36.0)
MCV: 93.1 fL (ref 80.0–100.0)
MCV: 94.5 fL (ref 80.0–100.0)
PLATELETS: 233 10*3/uL (ref 150–440)
Platelets: 215 10*3/uL (ref 150–440)
RBC: 4.16 MIL/uL (ref 3.80–5.20)
RBC: 4.25 MIL/uL (ref 3.80–5.20)
RDW: 13 % (ref 11.5–14.5)
RDW: 13 % (ref 11.5–14.5)
WBC: 20.7 10*3/uL — ABNORMAL HIGH (ref 3.6–11.0)
WBC: 20.2 10*3/uL — AB (ref 3.6–11.0)

## 2015-04-10 LAB — BASIC METABOLIC PANEL
ANION GAP: 11 (ref 5–15)
BUN: 58 mg/dL — ABNORMAL HIGH (ref 6–20)
CALCIUM: 8.2 mg/dL — AB (ref 8.9–10.3)
CO2: 23 mmol/L (ref 22–32)
Chloride: 107 mmol/L (ref 101–111)
Creatinine, Ser: 1.86 mg/dL — ABNORMAL HIGH (ref 0.44–1.00)
GFR calc non Af Amer: 24 mL/min — ABNORMAL LOW (ref >60)
GFR, EST AFRICAN AMERICAN: 28 mL/min — AB (ref >60)
GLUCOSE: 124 mg/dL — AB (ref 65–99)
POTASSIUM: 4.6 mmol/L (ref 3.5–5.1)
SODIUM: 141 mmol/L (ref 135–145)

## 2015-04-10 LAB — TROPONIN I
TROPONIN I: 8.44 ng/mL — AB (ref <0.031)
TROPONIN I: 10.18 ng/mL — AB (ref <0.031)

## 2015-04-10 LAB — HEPARIN LEVEL (UNFRACTIONATED): Heparin Unfractionated: 0.32 IU/mL (ref 0.30–0.70)

## 2015-04-10 MED ORDER — LORAZEPAM BOLUS VIA INFUSION
2.0000 mg | INTRAVENOUS | Status: DC | PRN
  Filled 2015-04-10: qty 5

## 2015-04-10 MED ORDER — MORPHINE 100MG IN NS 100ML (1MG/ML) PREMIX INFUSION
10.0000 mg/h | INTRAVENOUS | Status: DC
  Administered 2015-04-10: 4 mg/h via INTRAVENOUS
  Filled 2015-04-10: qty 100

## 2015-04-10 MED ORDER — ALBUTEROL SULFATE (2.5 MG/3ML) 0.083% IN NEBU: 2.5000 mg | INHALATION_SOLUTION | RESPIRATORY_TRACT | Status: DC | PRN

## 2015-04-10 MED ORDER — IPRATROPIUM-ALBUTEROL 0.5-2.5 (3) MG/3ML IN SOLN
3.0000 mL | Freq: Four times a day (QID) | RESPIRATORY_TRACT | Status: DC
  Administered 2015-04-10: 3 mL via RESPIRATORY_TRACT
  Filled 2015-04-10: qty 3

## 2015-04-10 MED ORDER — GLYCOPYRROLATE 0.2 MG/ML IJ SOLN
0.2000 mg | INTRAMUSCULAR | Status: DC | PRN
  Filled 2015-04-10: qty 1

## 2015-04-10 MED ORDER — DEXTROSE 5 % IV SOLN
1.0000 g | INTRAVENOUS | Status: DC
  Filled 2015-04-10: qty 10

## 2015-04-10 MED ORDER — GLYCOPYRROLATE 1 MG PO TABS
1.0000 mg | ORAL_TABLET | ORAL | Status: DC | PRN
  Filled 2015-04-10: qty 1

## 2015-04-10 MED ORDER — ACETAMINOPHEN 650 MG RE SUPP: 650.0000 mg | RECTAL | Status: DC | PRN

## 2015-04-10 MED ORDER — MORPHINE BOLUS VIA INFUSION
5.0000 mg | INTRAVENOUS | Status: DC | PRN
  Filled 2015-04-10: qty 20

## 2015-05-02 NOTE — Progress Notes (Signed)
Pt remains on bipap with an FiO2 at 85%; O2 sats mid to upper 90s's; pt confused to situation and time and lethargic; adequate uop; vss; Pts troponin's remain elevated Dr. Anne Hahn notified per md given orders continue to monitor no further orders at this time; no c/o pain; sinus rhythm on cardiac monitor; will continue to monitor and assess pt

## 2015-05-02 NOTE — Progress Notes (Signed)
ANTICOAGULATION CONSULT NOTE - Initial Consult  Pharmacy Consult for Heparin Drip Indication: chest pain/ACS  No Known Allergies  Patient Measurements: Height:  (162.6 cm) Weight: 112 lb 12.8 oz (51.166 kg) IBW/kg (Calculated) : 54.7 Heparin Dosing Weight: 54.4 kg  Vital Signs: Temp: 98.3 F (36.8 C) (02/07 0051) Temp Source: Axillary (02/07 0051) BP: 106/53 mmHg (02/07 0200) Pulse Rate: 87 (02/07 0200)  Labs:  Recent Labs  04/21/2015 1033 04/28/2015 1034 04/17/2015 1502 04/07/2015 2129 04/07/2015 2314 04-11-2015 0213  HGB 11.4*  --   --   --   --  12.7  HCT 34.1*  --   --   --   --  38.7  PLT 184  --   --   --   --  215  APTT  --  28  --   --   --   --   LABPROT  --  14.0  --   --   --   --   INR  --  1.06  --   --   --   --   HEPARINUNFRC  --   --   --   --  0.31  --   CREATININE 1.64*  --   --   --   --  1.86*  TROPONINI 6.53*  --  7.91* 8.44*  --   --     Estimated Creatinine Clearance: 18.2 mL/min (by C-G formula based on Cr of 1.86).   Medical History: Past Medical History  Diagnosis Date  . Hypertension   . Dementia     Medications:  Scheduled:  . antiseptic oral rinse  7 mL Mouth Rinse BID  . aspirin EC  81 mg Oral Daily  . aspirin EC  81 mg Oral Daily  . atorvastatin  80 mg Oral q1800  . donepezil  10 mg Oral QHS  . pravastatin  20 mg Oral Daily   Infusions:  . heparin 650 Units/hr (04/11/15 0200)    Assessment: 82 year old female history of dementia essential hypertension who is presenting with chest pain/NSTEMI  Goal of Therapy:  Heparin level 0.3-0.7 units/ml   Plan:  Give 3200 units bolus x 1 Start heparin infusion at 650 units/hr Check anti-Xa level in 8 hours and daily while on heparin Continue to monitor H&H and platelets   Anti-X ordered for 2/6 at 23:00.   2/6 23:00 anti-Xa 0.31. Recheck in 8 hours to confirm.  Madison Albea S, [facility] Clinical Pharmacist 04/11/2015,3:12 AM

## 2015-05-02 NOTE — Progress Notes (Signed)
Patient released by CDS.  Representative April Shore.  Referral # [id number]

## 2015-05-02 NOTE — Discharge Summary (Signed)
[facility] - [place] at [facility]   PATIENT NAME: Janice Reese    MR#:  [id number]  DATE OF BIRTH:  1928-12-16  DATE OF ADMISSION:  2015/05/05 ADMITTING PHYSICIAN: Wyatt Haste, MD  DATE OF DEATH: 05/05/2015 PRIMARY CARE PHYSICIAN: No primary care provider on file.    ADMISSION DIAGNOSIS:  NSTEMI (non-ST elevated myocardial infarction) (HCC) [I21.4]   DISCHARGE DIAGNOSIS:  NSTEMI Acute respiratory failure with hypoxia Acute systolic CHF (LV EF: 25% -  30%) Acute metabolic encephalopathy Acute kidney injury Hypotension SECONDARY DIAGNOSIS:   Past Medical History  Diagnosis Date  . Hypertension   . Dementia     HOSPITAL COURSE:   1. NSTEMI:  troponin up to 10, She was treated with heparin drip ,nitroglycerin when necessary, morphine when necessary, hold beta blockade given hypotension.   2. Essential hypertension: BP is low. Hold any HTN medication.  3. Acute kidney injury: given gentle IV fluid hydration but has acute CHF. Hold IV fluid.  * acute respiratory failure with hypoxia She was treated with BIPAP, NEB prn.  * Acute systolic CHF (LV EF: 25% -  30%). Given lasix, but BP is low, so Lasix was on hold.  * Hypotension. Hold lasix.  * Acute metabolic encephalopathy. Due to above. Precaution.  * leukocytosis. Possible due to reaction. F/u CBC.   High risk for cardiopulmonary arrest. Dr. Sung Amabile discussed with the patient's family member and put on comfort care. The patient expired at 14:40 today.  DISCHARGE CONDITIONS:   The patient expired.  CONSULTS OBTAINED:  Treatment Team:  Wyatt Haste, MD Merwyn Katos, MD     Shaune Pollack M.D on 05/06/15 at 3:32 PM  Between 7am to 6pm - Pager - [phone]  After 6pm go to [email or web address] - password [email or web address] [facility]  [facility]  Office  [phone]  CC: Primary care physician; No primary care provider on file.

## 2015-05-02 NOTE — Progress Notes (Addendum)
[facility] Physicians - [place] [facility]   PATIENT NAME: Janice Reese    MR#:  [id number]  DATE OF BIRTH:  1928/09/28  SUBJECTIVE:  CHIEF COMPLAINT:   Chief Complaint  Patient presents with  . Altered Mental Status   Confused and lethargic, on BIPAP REVIEW OF SYSTEMS:  Unable to get ROS.  DRUG ALLERGIES:  No Known Allergies  VITALS:  Blood pressure 93/49, pulse 86, temperature 98.1 F (36.7 C), temperature source Axillary, resp. rate 34, height  (1.626 m), weight 51.166 kg (112 lb 12.8 oz), SpO2 99 %.  PHYSICAL EXAMINATION:  GENERAL:  82 y.o.-year-old patient lying in the bed with lethargy. EYES: Pupils equal, round, reactive to light and accommodation. No scleral icterus. Extraocular muscles intact.  HEENT: Head atraumatic, normocephalic. Oropharynx and nasopharynx clear.  NECK:  Supple, no jugular venous distention. No thyroid enlargement, no tenderness.  LUNGS: Diminshed breath sounds bilaterally,  Bilateral severe crackle and rales. No use of accessory muscles of respiration.  CARDIOVASCULAR: S1, S2 normal. No murmurs, rubs, or gallops.  ABDOMEN: Soft, nontender, nondistended. Bowel sounds present. No organomegaly or mass.  EXTREMITIES: No pedal edema, cyanosis, or clubbing.  NEUROLOGIC: unable to exam.  PSYCHIATRIC: The patient is awake but confused.  SKIN: No obvious rash, lesion, or ulcer.    LABORATORY PANEL:   CBC  Recent Labs Lab 04/21/2015 0213  WBC 20.7*  HGB 12.7  HCT 38.7  PLT 215   ------------------------------------------------------------------------------------------------------------------  Chemistries   Recent Labs Lab 06-May-2015 1033 04/28/2015 0213  NA 138 141  K 4.5 4.6  CL 104 107  CO2 23 23  GLUCOSE 153* 124*  BUN 53* 58*  CREATININE 1.64* 1.86*  CALCIUM 8.5* 8.2*  AST 86*  --   ALT 50  --   ALKPHOS 67  --   BILITOT 0.6  --     ------------------------------------------------------------------------------------------------------------------  Cardiac Enzymes  Recent Labs Lab 04/09/2015 0213  TROPONINI 10.18*   ------------------------------------------------------------------------------------------------------------------  RADIOLOGY:  Dg Chest Portable 1 View  05/06/15  CLINICAL DATA:  Increased confusion of unknown duration, hypoxia in the emergency room. History of dementia. EXAM: PORTABLE CHEST 1 VIEW COMPARISON:  None. FINDINGS: There is cardiomegaly, mild to moderate in degree. There is central pulmonary vascular congestion and bilateral interstitial edema. Opacities at each lung base are likely a combination of small pleural effusions and atelectasis. Osseous structures about the chest are unremarkable. IMPRESSION: Cardiomegaly with central pulmonary vascular congestion and bilateral interstitial edema indicating volume overload/CHF. Opacities at each lung base are likely associated small pleural effusions and atelectasis. Electronically Signed   By: Bary Richard M.D.   On: 05-06-2015 11:08    EKG:   Orders placed or performed during the hospital encounter of 05/06/15  . EKG 12-Lead  . EKG 12-Lead  . EKG 12-Lead  . EKG 12-Lead    ASSESSMENT AND PLAN:   1. NSTEMI:  troponin up to 10, continue heparin drip ,nitroglycerin when necessary, morphine when necessary, f/u consult cardiology, hold beta blockade given hypotension  2. Essential hypertension: BP is low. Hold any HTN medication.  3. Acute kidney injury: given gentle IV fluid hydration but has acute CHF. Hold IV fluid.  * acute respiratory failure with hypoxia Continue BIPAP, NEB prn.  * Acute CHF. Given lasix, but BP is low, f/u cardiology and echo.  * Hypotension. Hold lasix. Dopamine prn.  * Acute metabolic encephalopathy. Due to above. Precaution.  * leukocytosis. Possible due to reaction. F/u CBC.   High  risk for  cardiopulmonary arrest.  All the records are reviewed and case discussed with Care Management/Social Workerr. Management plans discussed with the patient's son and he is in agreement.  CODE STATUS: DNR  TOTAL CRITICAL TIME TAKING CARE OF THIS PATIENT: 56 minutes.  Greater than 50% time was spent on coordination of care and face-to-face counseling.  POSSIBLE D/C IN ? DAYS, DEPENDING ON CLINICAL CONDITION.   Shaune Pollack M.D on April 12, 2015 at 8:13 AM  Between 7am to 6pm - Pager - [phone]  After 6pm go to [email or web address] - password [email or web address] [facility]  [place] [facility]  Office  [phone]  CC: Primary care physician; No primary care provider on file.

## 2015-05-02 NOTE — Consult Note (Signed)
PULMONARY / CRITICAL CARE MEDICINE   Name: Janice Reese MRN: [id number] DOB: 1928/11/11    ADMISSION DATE:  04/05/2015   CONSULTATION DATE: 04/13/15  REFERRING MD:  Dr Bridgett Larsson  CHIEF COMPLAINT: Chest pain and AMS  HISTORY OF PRESENT ILLNESS:   This is an 82 year old female history of dementia, essential hypertension and AS who is presented with chest pain/NSTEMI. History is obtained from that chart as patient is currently unresponsive. Patient was brought in by EMS from a group home. Upon arrival in the ED, she was hypoxic and bradycardic with HR in the 50s. Her EKG in the ED showed NSR, HR 70, ST elevation in V1, V2, and V3 with TWI in leads III, V4, V5, and V6 and her baseline troponin was 6.5 and peaked at 10.18. Overnight,  developed acute respiratory failure necessitating continuous BiPAP. She was transferred to the ICU and PCCM consulted to assist with  Care.She remains tachypneic and hypotensive on BiPAP.  2-D Echo reviewed by cardiology and demonstrated "critical AS" with an EF of 25-30%. Prognosis discussed with son/grandson. Decision made not to proceed with any surgical intervention. Patient made DNR/DNI/Comfort care  PAST MEDICAL HISTORY :  She  has a past medical history of Hypertension and Dementia.  PAST SURGICAL HISTORY: She  has no past surgical history on file.  No Known Allergies  No current facility-administered medications on file prior to encounter.   No current outpatient prescriptions on file prior to encounter.    FAMILY HISTORY:  Her has no family status information on file.   SOCIAL HISTORY: She  reports that she has never smoked. She does not have any smokeless tobacco history on file. She reports that she does not drink alcohol.  REVIEW OF SYSTEMS:   Unable to obtain due to extreme lethargy  SUBJECTIVE:   VITAL SIGNS: BP 93/49 mmHg  Pulse 88  Temp(Src) 98.1 F (36.7 C) (Axillary)  Resp 34  Ht 5' 4"  (1.626 m)  Wt 112 lb 12.8 oz (51.166 kg)   BMI 19.35 kg/m2  SpO2 97%  HEMODYNAMICS:    VENTILATOR SETTINGS: Vent Mode:  [-]  FiO2 (%):  [100 %] 100 %  INTAKE / OUTPUT:    PHYSICAL EXAMINATION: General: Acutely ill looking Neuro: awakens to voice and touch but unable to follow commands HEENT: BiPAP mask, PERRLA Cardiovascular: RRR, S1/S2, no gallop, +2 pulses Lungs:  Moderate increase in WOB, +rhonchi and crackles bilaterally Abdomen:  Soft, non-tender, non-distended, normal BS Musculoskeletal:  No joint deformities Skin:  Warm, no rash  LABS:  BMET  Recent Labs Lab 04/16/2015 1033 04/13/15 0213  NA 138 141  K 4.5 4.6  CL 104 107  CO2 23 23  BUN 53* 58*  CREATININE 1.64* 1.86*  GLUCOSE 153* 124*    Electrolytes  Recent Labs Lab 04/20/2015 1033 2015/04/13 0213  CALCIUM 8.5* 8.2*    CBC  Recent Labs Lab 04/07/2015 1033 April 13, 2015 0213 04/13/2015 0826  WBC 10.9 20.7* 20.2*  HGB 11.4* 12.7 12.9  HCT 34.1* 38.7 40.2  PLT 184 215 233    Coag's  Recent Labs Lab 04/25/2015 1034  APTT 28  INR 1.06    Sepsis Markers No results for input(s): LATICACIDVEN, PROCALCITON, O2SATVEN in the last 168 hours.  ABG No results for input(s): PHART, PCO2ART, PO2ART in the last 168 hours.  Liver Enzymes  Recent Labs Lab 04/16/2015 1033  AST 86*  ALT 50  ALKPHOS 67  BILITOT 0.6  ALBUMIN 3.4*    Cardiac Enzymes  Recent Labs Lab 04/08/2015 1502 04/28/2015 2129 Apr 22, 2015 0213  TROPONINI 7.91* 8.44* 10.18*    Glucose  Recent Labs Lab 04/13/2015 1723  GLUCAP 198*    Imaging No results found.   STUDIES:  2-D echo 02/06>LVEF 32-99%, severe systolic dysfunction, grade 1 diastolic function, critical AS with peak pressure of 81 mm hg, mild to moderate MR, and PA pressure 58 mm Hg  CULTURES: MRSA screen>negative  ANTIBIOTICS: Ceftriaxone 02/06-02/07  SIGNIFICANT EVENTS: 02/06>ED with chest pain and AMS 02/07>Comfort care  LINES/TUBES: PIVs  DISCUSSION: 82 YO female presenting with ACS, acute  respiratory failure and acute systolic heart failure. Patient is now full comfort care per family  ASSESSMENT  Acute hypoxic respiratory failure NSTEMI Acute systolic heart failure AKI  PLAN - Comfort care only -Wean off BiPAP -Supplemental O2 for comfort -Morphine gtte for sob -Ativan for anxiety/agitation -Oral care per protocol -D/C oral meds -Oral fluids for comfort as tolerated   FAMILY  - Updates: Dr. Leonidas Romberg and this writer met with patient's son and grandson and discussed echo findings, overall prognosis and goals of care. Family want patient made comfortable. They had discussed with patient's primary care doctor who recommended comfort care. End of life care orders placed in the chart. Support provided.   Best Practice: Code Status:  DNR/DNI. Diet: regular as tolerated. GI prophylaxis:  Not indicated/Comfort care. VTE prophylaxis:  SCD's / was on heparin infusion  CC time 45 minutes  Janice Reese ANP-BC Pulmonary and Critical Care Medicine [facility]  04-22-2015, 12:21 PM  I have evaluated patient with ANP Janice Reese, reviewed database in its entirety and discussed care plan in detail. In addition, this patient was discussed on multidisciplinary rounds. We were initially asked to see her by Dr Rockey Situ. She had respiratory distress requiring BiPAP, bilateral pulmonary infiltrates and was noted to be very frail appearing. Dr Rockey Situ subsequently reviewed Echocardiogram and shared the findings with me. It became evident that she had end stage cardiomyopathy - both systolic failure and critical AS - with pulmonary edema and cardiorenal syndrome. She was clearly not a candidate for aggressive intervention based on her severe frailty and advanced age. I arranged conference with with pt's son and grandson and conveyed the dire situation. I indicated that the only reasonable option would be to provide comfort care measures. He had spoken already with the pt's primary MD  who concurred with this assessment even without the knowledge of the echocardiogram findings. We made the plan to initiate morphine infusion and discontinue BiPAP. I conveyed our plan to Dr Bridgett Larsson who agreed with it. The patient subsequently passed away peacefully in the ICU    Janice Border, MD PCCM service Mobile [phone] Pager [phone]

## 2015-05-02 NOTE — Consult Note (Signed)
Cardiology Consult    Patient ID: Janice Reese MRN: [id number], DOB/AGE: 80-Aug-1930   Admit date: May 09, 2015 Date of Consult: 04/29/2015  Primary Physician: No primary care provider on file. Reason for Consult: NSTEMI Primary Cardiologist: [facility] Requesting Provider: Dr. Clint Guy   History of Present Illness    Janice Reese is a 82 y.o. female with past medical history of HTN and Dementia who presented to [facility] on 05-09-15 with altered mental status and chest discomfort.   The patient has baseline dementia so most history for this documentation is obtained from the patient's chart and conversations with the medical staff caring for her.   She was hypoxic on arrival with oxygen saturations at 92% on 4L [place], with a previous oxygen requirement at home. EKG showed NSR, HR 70, ST elevation in V1, V2, and V3 with TWI in leads III, V4, V5, and V6 however she does have an incomplete LBBB. Her initial troponin was elevated to 6.53. Cyclic values overnight have been 7.91, 8.44, and 10.18. Creatinine elevated to 1.64.Hgb at 11.4. WBC 10.9. Platelets 184.  Overnight, she went into respiratory distress with oxygen saturations in the 70's on 5L [place]. She was placed on a NRB and given Lasix  IV. She required BiPAP and was transferred to Step-Down. A CXR was obtained which showed cardiomegaly with central pulmonary vascular congestion and bilateral interstitial edema consistent with volume overload/CHF.  On examination this morning, she remains on BiPAP with oxygen saturations in the high-90's. She denies any chest discomfort this morning. She seems frustrated with her BiPAP during our encounter, wanting to remove it.    Past Medical History   Past Medical History  Diagnosis Date  . Hypertension   . Dementia      Allergies:  No Known Allergies  Inpatient Medications    . antiseptic oral rinse  7 mL Mouth Rinse BID  . aspirin EC  81 mg Oral Daily  . aspirin EC  81 mg Oral Daily  .  atorvastatin  80 mg Oral q1800  . donepezil  10 mg Oral QHS  . pravastatin  20 mg Oral Daily    Family History    Family History  Problem Relation Age of Onset  . Diabetes Mellitus II Neg Hx     Social History    Social History   Social History  . Marital Status: Widowed    Spouse Name: N/A  . Number of Children: N/A  . Years of Education: N/A   Occupational History  . Not on file.   Social History Main Topics  . Smoking status: Never Smoker   . Smokeless tobacco: Not on file  . Alcohol Use: No  . Drug Use: Not on file  . Sexual Activity: Not on file   Other Topics Concern  . Not on file   Social History Narrative     Review of Systems    Unable to be obtained secondary to altered mental status.  Physical Exam    Blood pressure 93/49, pulse 86, temperature 98.1 F (36.7 C), temperature source Axillary, resp. rate 34, height  (1.626 m), weight 112 lb 12.8 oz (51.166 kg), SpO2 99 %.  General: Pleasantly demented elderly Caucasian female appearing in NAD. Psych: Normal affect. Neuro: Alert and oriented X 2 (person and place). Moves all extremities spontaneously. HEENT: Normal  Neck: Supple without bruits or JVD. Lungs:  Resp regular and unlabored, Rales at bases bilaterally. Marland Kitchen Heart: RRR no s3, s4, 2/6 SEM  at LUSB. Abdomen: Soft, non-tender, non-distended, BS + x 4.  Extremities: No clubbing, cyanosis or edema. DP/PT/Radials 2+ and equal bilaterally.  Labs    Troponin (Point of Care Test) No results for input(s): TROPIPOC in the last 72 hours.  Recent Labs  04/07/2015 1033 04/23/2015 1502 04/23/2015 2129 2015-05-04 0213  TROPONINI 6.53* 7.91* 8.44* 10.18*   Lab Results  Component Value Date   WBC 20.7* May 04, 2015   HGB 12.7 05-04-2015   HCT 38.7 May 04, 2015   MCV 93.1 May 04, 2015   PLT 215 04-May-2015    Recent Labs Lab 04/20/2015 1033 05-04-15 0213  NA 138 141  K 4.5 4.6  CL 104 107  CO2 23 23  BUN 53* 58*  CREATININE 1.64* 1.86*  CALCIUM  8.5* 8.2*  PROT 6.5  --   BILITOT 0.6  --   ALKPHOS 67  --   ALT 50  --   AST 86*  --   GLUCOSE 153* 124*   Lab Results  Component Value Date   CHOL 178 05-04-15   HDL 85 05/04/15   LDLCALC 81 05-04-15   TRIG 61 May 04, 2015     Radiology Studies    Ct Head Wo Contrast: 03/28/2015  CLINICAL DATA:  Fall EXAM: CT HEAD WITHOUT CONTRAST TECHNIQUE: Contiguous axial images were obtained from the base of the skull through the vertex without intravenous contrast. COMPARISON:  None. FINDINGS: Mild global atrophy. Chronic ischemic changes in the periventricular white matter. Small lacunar infarcts in the caudate head nuclei bilaterally. No mass effect, midline shift, or acute intracranial hemorrhage. IMPRESSION: No acute intracranial pathology. Electronically Signed   By: Jolaine Click M.D.   On: 03/28/2015 19:36   Dg Chest Portable 1 View: 04/24/2015  CLINICAL DATA:  Increased confusion of unknown duration, hypoxia in the emergency room. History of dementia. EXAM: PORTABLE CHEST 1 VIEW COMPARISON:  None. FINDINGS: There is cardiomegaly, mild to moderate in degree. There is central pulmonary vascular congestion and bilateral interstitial edema. Opacities at each lung base are likely a combination of small pleural effusions and atelectasis. Osseous structures about the chest are unremarkable. IMPRESSION: Cardiomegaly with central pulmonary vascular congestion and bilateral interstitial edema indicating volume overload/CHF. Opacities at each lung base are likely associated small pleural effusions and atelectasis. Electronically Signed   By: Bary Richard M.D.   On: 04/05/2015 11:08    EKG & Cardiac Imaging    EKG: NSR, HR 70, ST elevation in V1, V2, and V3 with TWI in leads III, V4, V5, and V6 with an incomplete LBBB present.  Echocardiogram: Pending  Assessment & Plan     1. NSTEMI - presented with complaints of chest discomfort. Denies any current pain or anginal equivalents. No known prior  cardiac history. - initial EKG showed NSR, HR 70, ST elevation in V1, V2, and V3 with TWI in leads III, V4, V5, and V6 with an incomplete LBBB present. Will obtain a repeat EKG this AM. - Echocardiogram is pending.  - continue Heparin drip for at least 48 hours. Dosing per pharmacy. Would likely favor medical management in this 82 year old with baseline dementia. No family present at the bedside during this encounter to discuss her condition. - patient currently on Pravastatin and Lipitor. Will discontinue Pravastatin at this time in favor of high-intensity statin.  - No BB currently due to hypotension.   2. HTN - BP has been 87/48 - 128/73 over the past 24 hours - continue to monitor  3. Acute Respiratory Distress -  oxygen saturations in the 70's on 5L [place] overnight. Placed on NRB and given Lasix 40mg  IV. She required BiPAP and was transferred to Step-Down. CXR showed cardiomegaly with central pulmonary vascular congestion and bilateral interstitial edema.  - would hold additional IV Lasix today with up-trending creatinine.  4. AKI - unable to establish baseline creatinine with only 3 recorded values. Was 1.31 on 03/16/2015. - elevated to 1.64 on admission with increase to 1.86 this AM after receiving IV Lasix.   Signed, Ellsworth Lennox, PA-C 04/04/2015, 7:36 AM Pager: [phone]

## 2015-05-02 NOTE — Progress Notes (Signed)
ANTICOAGULATION CONSULT NOTE - Initial Consult  Pharmacy Consult for Heparin Drip Indication: chest pain/ACS  No Known Allergies  Patient Measurements: Height:  (162.6 cm) Weight: 112 lb 12.8 oz (51.166 kg) IBW/kg (Calculated) : 54.7 Heparin Dosing Weight: 54.4 kg  Vital Signs: Temp: 98.1 F (36.7 C) (02/07 0500) Temp Source: Axillary (02/07 0500) BP: 93/49 mmHg (02/07 0600) Pulse Rate: 88 (02/07 0800)  Labs:  Recent Labs  Apr 16, 2015 1033 Apr 16, 2015 1034 Apr 16, 2015 1502 04/16/15 2129 04/16/15 2314 04/12/2015 0213 04/29/2015 0826  HGB 11.4*  --   --   --   --  12.7 12.9  HCT 34.1*  --   --   --   --  38.7 40.2  PLT 184  --   --   --   --  215 233  APTT  --  28  --   --   --   --   --   LABPROT  --  14.0  --   --   --   --   --   INR  --  1.06  --   --   --   --   --   HEPARINUNFRC  --   --   --   --  0.31  --  0.32  CREATININE 1.64*  --   --   --   --  1.86*  --   TROPONINI 6.53*  --  7.91* 8.44*  --  10.18*  --     Estimated Creatinine Clearance: 18.2 mL/min (by C-G formula based on Cr of 1.86).   Medical History: Past Medical History  Diagnosis Date  . Hypertension   . Dementia     Medications:  Scheduled:  . antiseptic oral rinse  7 mL Mouth Rinse BID  . aspirin EC  81 mg Oral Daily  . atorvastatin  80 mg Oral q1800  . cefTRIAXone (ROCEPHIN)  IV  1 g Intravenous Q24H  . donepezil  10 mg Oral QHS  . ipratropium-albuterol  3 mL Nebulization Q6H   Infusions:  . heparin 650 Units/hr (04/25/2015 0600)    Assessment: 82 year old female history of dementia essential hypertension who is presenting with chest pain/NSTEMI  Goal of Therapy:  Heparin level 0.3-0.7 units/ml   Plan:  Heparin level remains at goal though at the low end of goal range. Will increase heparin infusion to 700 units/hr and check another level in 8 hours.   Valentina Gu, [facility] Clinical Pharmacist 04/26/2015,9:07 AM

## 2015-05-02 NOTE — Progress Notes (Signed)
   2015/04/18 0945  Clinical Encounter Type  Visited With Patient and family together  Visit Type Initial;Spiritual support  Referral From Nurse  Consult/Referral To Chaplain  Spiritual Encounters  Spiritual Needs Grief support;Prayer  Stress Factors  Patient Stress Factors Health changes  Family Stress Factors Loss;Major life changes  Met w/patient & family. Family expressed grief due to recent loss of patient's son's spouse. Provided pastoral presence & care. Provided prayer with all present. Chap. Aubery Douthat G. [place]

## 2015-05-02 NOTE — Progress Notes (Signed)
Patient pronounced by Mellissa Kohut, RN and Verlon Au, RN. Patient with no audible heart tones, respirations, or blood pressure.

## 2015-05-02 DEATH — deceased

## 2016-02-28 IMAGING — CT CT HEAD W/O CM
1 of 2 series · 14 of 30 positions shown, 18 images · non-contrast
Comparison: None.

CLINICAL DATA: Fall

EXAM:
CT HEAD WITHOUT CONTRAST
TECHNIQUE: Contiguous axial images were obtained from the base of the skull
through the vertex without intravenous contrast.

[Series 2: head wo · axial · 0.41mm/px · z∈[+85,+215]mm · 14 of 31 slices shown, 18 images]
[im 3/31  brain]
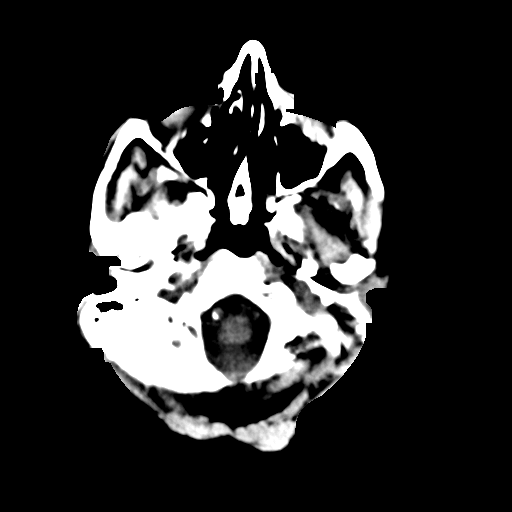
[im 3/31  bone]
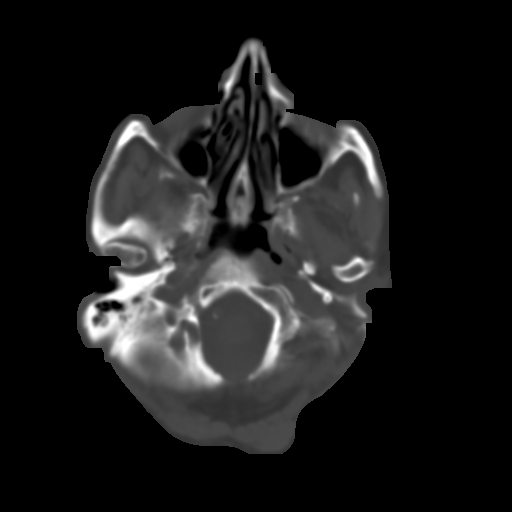
[im 5/31  brain]
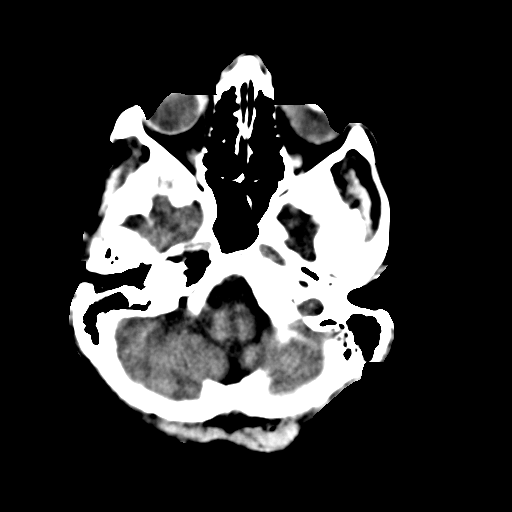
[im 7/31  brain]
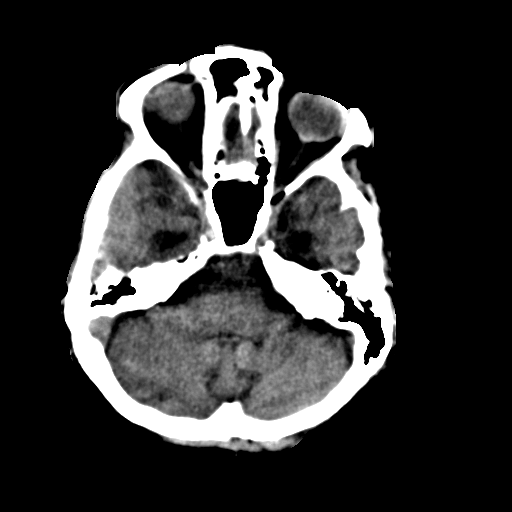
[im 9/31  brain]
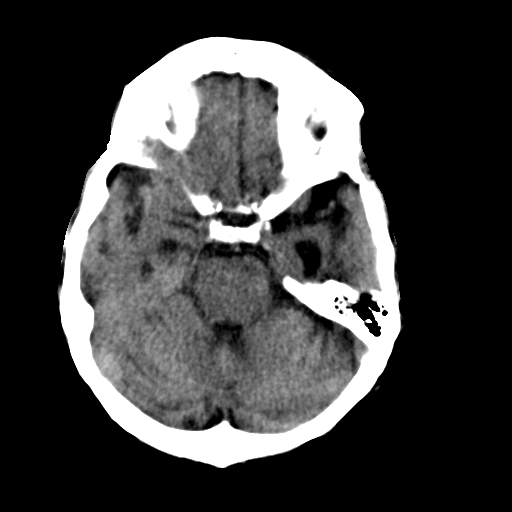
[im 11/31  brain]
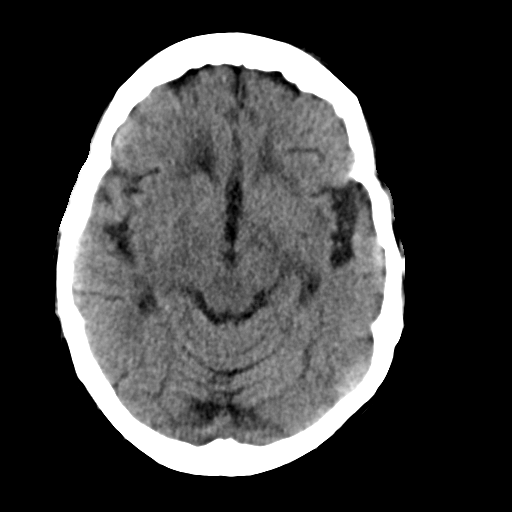
[im 11/31  bone]
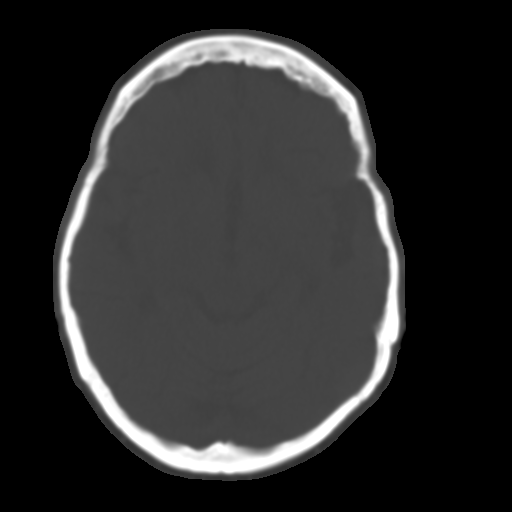
[im 13/31  brain]
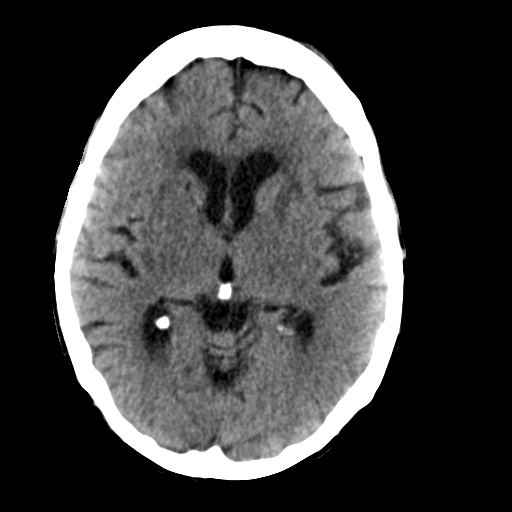
[im 15/31  brain]
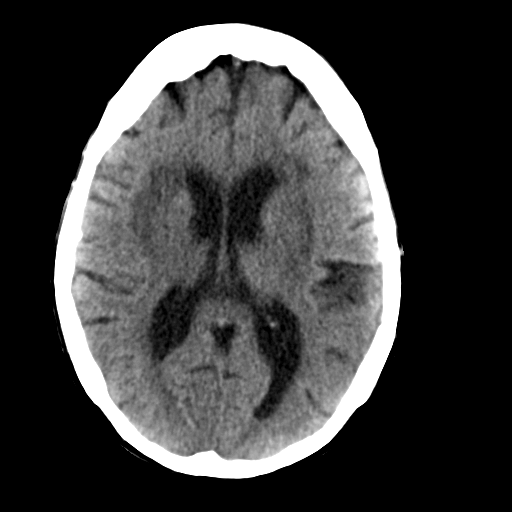
[im 17/31  brain]
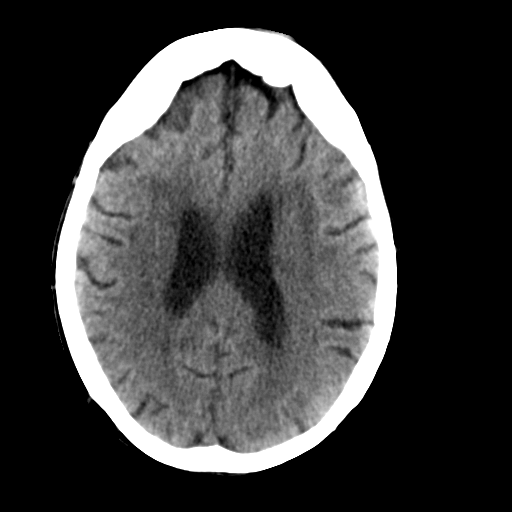
[im 19/31  brain]
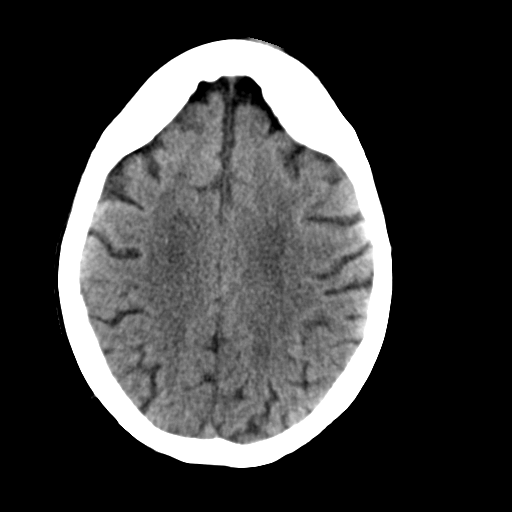
[im 19/31  bone]
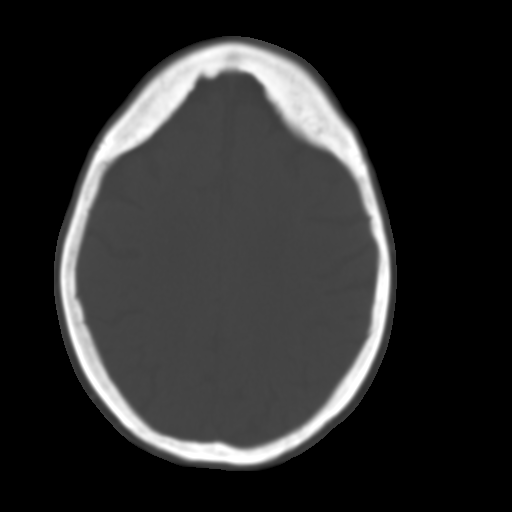
[im 21/31  brain]
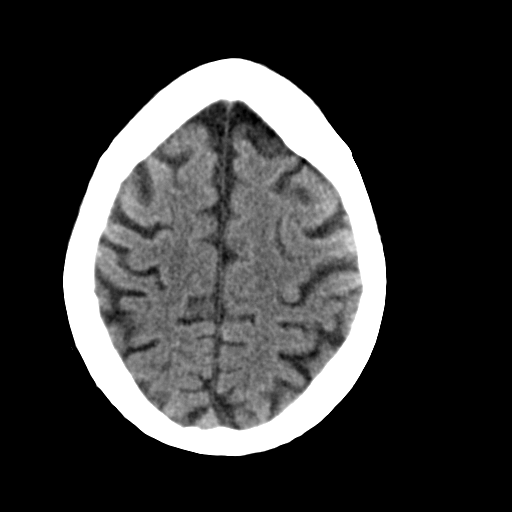
[im 23/31  brain]
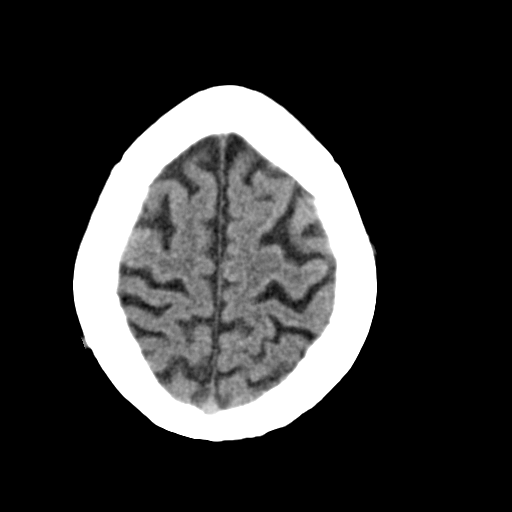
[im 25/31  brain]
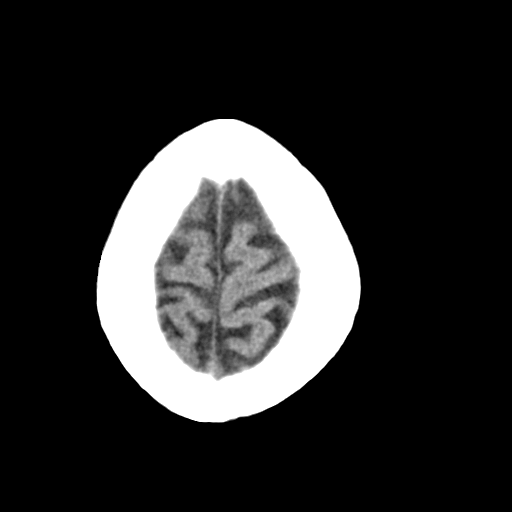
[im 27/31  brain]
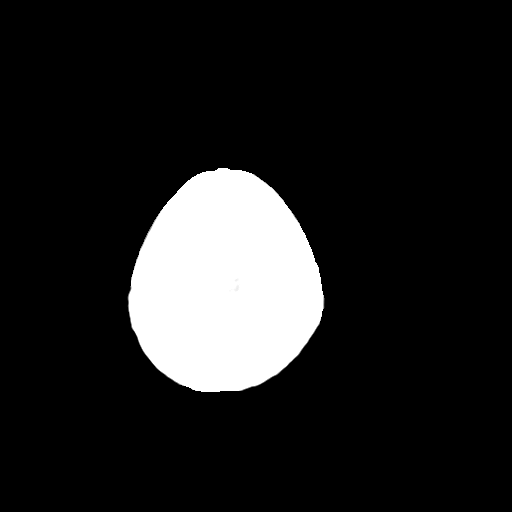
[im 27/31  bone]
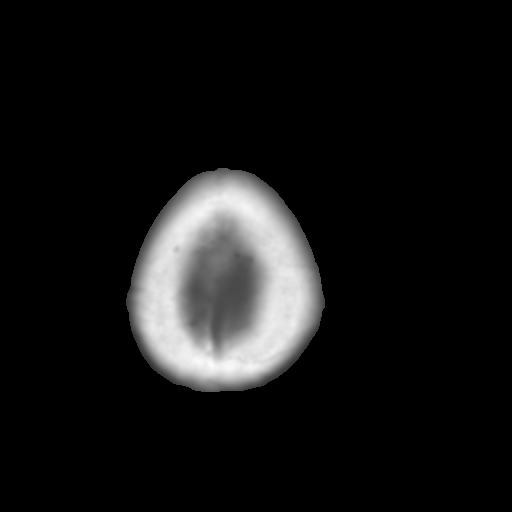
[im 29/31  brain]
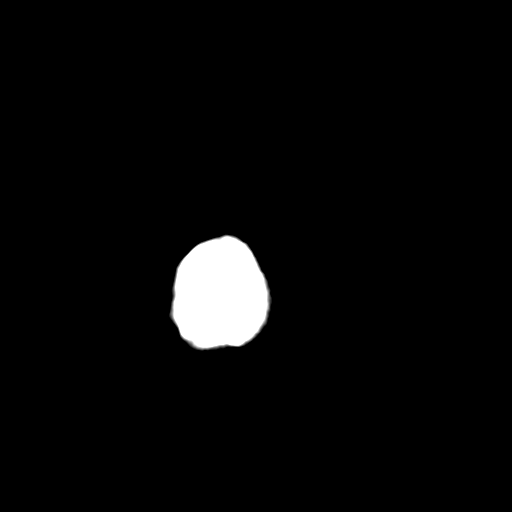

[14 of 30 positions shown; findings below may reference images not displayed]

FINDINGS: Mild global atrophy. Chronic ischemic changes in the periventricular
white matter. Small lacunar infarcts in the caudate head nuclei
bilaterally. No mass effect, midline shift, or acute intracranial
hemorrhage.
IMPRESSION: No acute intracranial pathology.
